# Patient Record
Sex: Male | Born: 1980
Health system: Western US, Academic
[De-identification: ages and names within clinical notes are randomized; demographics above are authoritative.]

## PROBLEM LIST (undated history)

## (undated) ENCOUNTER — Emergency Department (HOSPITAL_COMMUNITY): Admission: EM | Discharge status: Absent without leave | Payer: Self-pay

## (undated) DIAGNOSIS — Z720 Tobacco use: Secondary | ICD-10-CM

## (undated) DIAGNOSIS — F200 Paranoid schizophrenia: Secondary | ICD-10-CM

## (undated) DIAGNOSIS — F1910 Other psychoactive substance abuse, uncomplicated: Secondary | ICD-10-CM

## (undated) DIAGNOSIS — F1010 Alcohol abuse, uncomplicated: Secondary | ICD-10-CM

## (undated) DIAGNOSIS — F419 Anxiety disorder, unspecified: Secondary | ICD-10-CM

## (undated) DIAGNOSIS — F319 Bipolar disorder, unspecified: Secondary | ICD-10-CM

## (undated) DIAGNOSIS — G894 Chronic pain syndrome: Secondary | ICD-10-CM

## (undated) DIAGNOSIS — F909 Attention-deficit hyperactivity disorder, unspecified type: Secondary | ICD-10-CM

## (undated) DIAGNOSIS — F411 Generalized anxiety disorder: Secondary | ICD-10-CM

## (undated) DIAGNOSIS — G4700 Insomnia, unspecified: Secondary | ICD-10-CM

## (undated) DIAGNOSIS — F1994 Other psychoactive substance use, unspecified with psychoactive substance-induced mood disorder: Secondary | ICD-10-CM

---

## 2015-02-09 ENCOUNTER — Encounter (HOSPITAL_COMMUNITY): Payer: Self-pay | Admitting: Psychiatry

## 2015-02-09 ENCOUNTER — Ambulatory Visit (INDEPENDENT_AMBULATORY_CARE_PROVIDER_SITE_OTHER): Payer: 59 | Admitting: Psychiatry

## 2015-02-09 ENCOUNTER — Encounter (INDEPENDENT_AMBULATORY_CARE_PROVIDER_SITE_OTHER): Payer: Self-pay

## 2015-02-09 VITALS — BP 126/88 | HR 94 | Ht 66.0 in | Wt 155.0 lb

## 2015-02-09 DIAGNOSIS — F419 Anxiety disorder, unspecified: Secondary | ICD-10-CM

## 2015-02-09 DIAGNOSIS — F900 Attention-deficit hyperactivity disorder, predominantly inattentive type: Secondary | ICD-10-CM

## 2015-02-09 DIAGNOSIS — F32A Depression, unspecified: Secondary | ICD-10-CM

## 2015-02-09 DIAGNOSIS — F329 Major depressive disorder, single episode, unspecified: Secondary | ICD-10-CM

## 2015-02-09 MED ORDER — BUPROPION HCL ER (XL) 150 MG PO TB24: 150.0000 mg | ORAL_TABLET | ORAL | Status: DC

## 2015-02-09 NOTE — Progress Notes (Signed)
[facility] Initial [facility]  Lucas CarawayChristopher M [name] [id number] 34 y.o.  02/09/2015 10:09 AM  Chief Complaint:  I need medication for my focus and attention.  I will lose my job if I don't pay attention at work.  History of Present Illness:  Patient is 34 year old, Caucasian, single, employed man who is self-referred for seeking treatment for his ADD symptoms.  Patient found very nervous and anxious while he was waiting in the lobby .  Patient told he has symptoms of ADD since childhood and he has given Dexedrine and Ritalin in the school but he has not taken these medication in a while.  He has noticed his symptoms of ADHD is getting worse in recent months.  Patient is working as a [employer] and he has trouble doing multitasking.  He is afraid that he may lose his job as he has given warnings few times because he is unable to finish his work.  Patient also endorsed symptoms of anxiety and panic attack.  He mentioned lately panic attacks are happening 1-2 times a week which is usually last 3-4 minutes.  He is very concerned about the job.  He also endorsed financial strains.  He is living with his cousin who is married to "a stripper" and using steroids.  He wants to move out because he does not like the situation there but he has no money.  He denies any insomnia but admitted racing thoughts, some time irritability, frustration and decreased energy.  He admitted lately notice loss of interest in his daily life and sadness.  He endorsed low self-esteem but denies any active or passive suicidal thoughts or homicidal thought.  His major concerns is lack of focus, attention, difficulty doing multitasking and decreased energy.  Patient denies any mania, psychosis, hallucination, OCD symptoms.  He has limited contact with his parents and his siblings.  Patient told he do not get along with his parents who had history of using drugs and alcohol.  Patient denies using drugs or alcohol.  He  smokes cigarettes but wanted to stop because it is causing financial strain.  Patient denies any anger issues, anhedonia or any grandiosity.  He like to try medication that can help his anxiety and ADD symptoms.  Patient currently not taking any medication and he is not interested in counseling at this time.  Suicidal Ideation: No Plan Formed: No Patient has means to carry out plan: No  Homicidal Ideation: No Plan Formed: No Patient has means to carry out plan: No  Past Psychiatric History/Hospitalization(s) Patient was diagnosed ADHD when he was in the school.  He remember having poor grades but ADHD medication helps passing the high school.  He had tried Dexedrine and Ritalin in the past.  Patient denies any history of suicidal attempt, inpatient psychiatric treatment, mania, psychosis or any hallucination. Anxiety: Yes Bipolar Disorder: No Depression: Yes Mania: No Psychosis: No Schizophrenia: No Personality Disorder: No Hospitalization for psychiatric illness: No History of Electroconvulsive Shock Therapy: No Prior Suicide Attempts: No  Medical History; Patient has no active medical problem.  He has no primary care physician.  He denies any history of seizures, headaches or any other significant health issues.  Traumatic brain injury: Patient denies any history of traumatic brain injury.  Family History; Patient endorsed multiple family member from his father's side has depression, alcohol and drug problem.  Education and Work History; Patient had high school education.  He had worked in the past in [employer].  He  is working as a Estate agent at [employer].   Psychosocial History; Patient born and raised in [place].  His parents divorced when he was only 50 years old.  He was raised by his grandmother.  Patient has very limited contact with his mother and his father.  He has half brother and sister but he has no contact with them.  Currently he is living  with his cousin and he is not happy with his living situation and he wants to move out.  Patient is single and he has no children.  Currently he is not in any relationship.  Legal History; Patient denies any legal issues.  History Of Abuse; Patient denies any history of physical, sexual, verbal or emotional abuse.  Substance Abuse History; Patient admitted history of smoking marijuana but stopped 5 years ago.  He denies drinking or using any illegal substance at this time.  He denies any history of shakes, tremors, withdrawal or any seizures.  Review of Systems: Psychiatric: Agitation: No Hallucination: No Depressed Mood: Yes Insomnia: No Hypersomnia: No Altered Concentration: No Feels Worthless: No Grandiose Ideas: No Belief In Special Powers: No New/Increased Substance Abuse: No Compulsions: No  Neurologic: Headache: No Seizure: No Paresthesias: No  Musculoskeletal: Strength & Muscle Tone: within normal limits Gait & Station: normal Patient leans: N/A   Outpatient Encounter Prescriptions as of 02/09/2015  Medication Sig  . buPROPion (WELLBUTRIN XL) 150 MG 24 hr tablet Take 1 tablet (150 mg total) by mouth every morning.    No results found for this or any previous visit (from the past 2160 hour(s)).    Constitutional:  BP 126/88 mmHg  Pulse 94  Ht  (1.676 m)  Wt 155 lb (70.308 kg)  BMI 25.03 kg/m2   Mental Status Examination;  Patient is casually dressed and fairly groomed.  He appears anxious nervous and restless.  He maintained fair eye contact.  His thought process circumstantial.  His attention and concentration is easily distracted.  Most of the answers he apply with "not really".  He denies any auditory or visual hallucination.  He denies any active or passive suicidal thoughts or homicidal thought.  His psychomotor activity is slightly increased.  He has no tremors, shakes or any EPS.  He described his mood anxious and nervous and his affect is mood  appropriate.  There were no delusions, paranoia or any obsessive thoughts.  His fund of knowledge is fair.  His cognition is intact.  He is alert and oriented 3.  His insight judgment and impulse control is fair.   New problem, with additional work up planned, Review of Psycho-Social Stressors (1), Review or order clinical lab tests (1), Review and summation of old records (2), Established Problem, Worsening (2), New Problem, with no additional work-up planned (3) and Review of Medication Regimen & Side Effects (2)  Assessment: Axis I: ADHD inattentive type, anxiety disorder NOS   Axis II: Deferred  Axis III:  History reviewed. No pertinent past medical history.   Plan:  I review his symptoms, history, current medication and psychosocial stressors.  We will try Wellbutrin XL 150 mg to help his ADD symptoms and anxiety symptoms.  I discuss giving stimulants may cause worsening of his anxiety.  At this time patient does not want any counseling.  Discussed medication side effects especially headaches, GI side effects and in the beginning may increase the anxiety symptoms.  I would also do blood work since he has no blood work in  a while.  I will order CBC, CMP, hemoglobin A1c, TSH and lipid panel.  Recommended to call us back if he has any question or any concern.  Follow-up in 2-3 weeks. Time spent 55 minutes.  More than 50% of the time spent in psychoeducation, counseling and coordination of care.  Discuss safety plan that anytime having active suicidal thoughts or homicidal thoughts then patient need to call 911 or go to the local emergency room.    ARFEEN,SYED T., MD 02/09/2015

## 2015-03-03 ENCOUNTER — Ambulatory Visit (INDEPENDENT_AMBULATORY_CARE_PROVIDER_SITE_OTHER): Payer: 59 | Admitting: Psychiatry

## 2015-03-03 ENCOUNTER — Encounter (HOSPITAL_COMMUNITY): Payer: Self-pay | Admitting: Psychiatry

## 2015-03-03 VITALS — BP 100/71 | HR 98 | Ht 66.0 in | Wt 160.4 lb

## 2015-03-03 DIAGNOSIS — F900 Attention-deficit hyperactivity disorder, predominantly inattentive type: Secondary | ICD-10-CM

## 2015-03-03 DIAGNOSIS — F419 Anxiety disorder, unspecified: Secondary | ICD-10-CM

## 2015-03-03 MED ORDER — LISDEXAMFETAMINE DIMESYLATE 20 MG PO CAPS: 20.0000 mg | ORAL_CAPSULE | Freq: Every day | ORAL | Status: DC

## 2015-03-03 NOTE — Progress Notes (Signed)
[facility] [id number] Progress Note  Lucas [name] [id number] 34 y.o.  03/03/2015 4:13 PM  Chief Complaint:  I do not like Wellbutrin.  It is making me more sad and out of touch.  I stop taking Wellbutrin.    History of Present Illness:  Lucas [name] came for his follow-up appointment.  He is a 34 year old Caucasian single employed man who was seen 3 weeks ago as initial evaluation.  Patient has history of ADHD and lately his symptoms are getting worse.  He has difficulty completing his job.  He is working as a Museum/gallery exhibitions officer and there has been a lot of recent concern about his attention and concentration.  He is afraid that he may lose his job.  He started Wellbutrin however after taking few days he feels more sad, emotional and felt very detached.  He felt that he is out of reality and gets scared and stop the medication.  He remembered taking Ritalin and Dexedrine in the past however he wants to try a different medication because he feels Ritalin and Dexedrine did help some of his attention.  He still have lack of attention, concentration and decreased energy.  He takes a lot of time to finish his job.  He admitted being nervous and anxious about his job.  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  Patient denies drinking or using any illegal substances.  Patient denies any paranoia, hallucination, psychosis or any suicidal thoughts or homicidal thought.  His appetite is okay.  Sometime he takes NyQuil to sleep because he is thinking about his job.  His appetite is okay.  His vitals are stable.  He has not gotten blood work but promised to have it done before his next visit.  Suicidal Ideation: No Plan Formed: No Patient has means to carry out plan: No  Homicidal Ideation: No Plan Formed: No Patient has means to carry out plan: No  Past Psychiatric  History/Hospitalization(s) Patient was diagnosed ADHD when he was in the school.  He remember having poor grades but ADHD medication helps passing the high school.  He had tried Dexedrine and Ritalin in the past.  Patient denies any history of suicidal attempt, inpatient psychiatric treatment, mania, psychosis or any hallucination. Anxiety: Yes Bipolar Disorder: No Depression: Yes Mania: No Psychosis: No Schizophrenia: No Personality Disorder: No Hospitalization for psychiatric illness: No History of Electroconvulsive Shock Therapy: No Prior Suicide Attempts: No  Medical History; Patient has no active medical problem.  He has no primary care physician.  He denies any history of seizures, headaches or any other significant health issues.  Education and Work History; Patient had high school education.  He had worked in the past in [employer].  He is working as a Estate agent at [employer].   Psychosocial History; Patient born and raised in [place].  His parents divorced when he was only 37 years old.  He was raised by his grandmother.  Patient has very limited contact with his mother and his father.  He has half brother and sister but he has no contact with them.  Currently he is living with his cousin and he is not happy with his living situation and he wants to move out.  Patient is single and he has no children.  Currently he is not in any relationship.  Review of  Systems: Psychiatric: Agitation: No Hallucination: No Depressed Mood: Yes Insomnia: No Hypersomnia: No Altered Concentration: No Feels Worthless: No Grandiose Ideas: No Belief In Special Powers: No New/Increased Substance Abuse: No Compulsions: No  Neurologic: Headache: No Seizure: No Paresthesias: No  Musculoskeletal: Strength & Muscle Tone: within normal limits Gait & Station: normal Patient leans: N/A   Outpatient Encounter Prescriptions as of 03/03/2015  Medication Sig  .  lisdexamfetamine (VYVANSE) 20 MG capsule Take 1 capsule (20 mg total) by mouth daily.  . [DISCONTINUED] buPROPion (WELLBUTRIN XL) 150 MG 24 hr tablet Take 1 tablet (150 mg total) by mouth every morning.    No results found for this or any previous visit (from the past 2160 hour(s)).    Constitutional:  BP 100/71 mmHg  Pulse 98  Ht 5\' 6"  (1.676 m)  Wt 160 lb 6.4 oz (72.757 kg)  BMI 25.90 kg/m2   Mental Status Examination;  Patient is poorly groomed and dressed.  He is wearing his uniform.  He appears anxious, nervous and restless.  He maintained fair eye contact.  His thought process circumstantial.  His attention and concentration is easily distracted.  Most of the answers he replied "not really".  He denies any auditory or visual hallucination.  He denies any active or passive suicidal thoughts or homicidal thought.  His psychomotor activity is decreased.  He has no tremors, shakes or any EPS.  He described his mood anxious and nervous and his affect is mood appropriate.  There were no delusions, paranoia or any obsessive thoughts.  His fund of knowledge is fair.  His cognition is intact.  He is alert and oriented 3.  His insight judgment and impulse control is fair.   Review of Psycho-Social Stressors (1), Review and summation of old records (2), Established Problem, Worsening (2), Review of Medication Regimen & Side Effects (2) and Review of New Medication or Change in Dosage (2)  Assessment: Axis I: ADHD inattentive type, anxiety disorder NOS   Axis II: Deferred  Axis III:  History reviewed. No pertinent past medical history.   Plan:  I will discontinue Wellbutrin since patient does not see any improvement and actually got worse emotionally.  He does not want Dexedrine or Ritalin since he has taken in the past and not sure the response of the medication.  I recommended to try Vyvanse to help his ADHD.  I reinforce blood work and he promised to do it before his next appointment.   Discussed medication side effects and benefits.  Recommended to call us back if he has any question or any concern.  Follow-up in 4 weeks.  Time spent 25 minutes  More than 50% of the time spent in psychoeducation, counseling and coordination of care.  Discuss safety plan that anytime having active suicidal thoughts or homicidal thoughts then patient need to call 911 or [name] to the local emergency room.    ARFEEN,SYED T., MD 03/03/2015

## 2015-03-04 ENCOUNTER — Telehealth (HOSPITAL_COMMUNITY): Payer: Self-pay | Admitting: *Deleted

## 2015-03-04 ENCOUNTER — Telehealth (HOSPITAL_COMMUNITY): Payer: Self-pay

## 2015-03-04 NOTE — Telephone Encounter (Signed)
Telephone call with patient to inform him Ok AnisKelly Corbridge, CMA had gotten his Vyvanse authorized and had already called his pharmacy with information so they could fill his needed new order.  Requested patient call back if any more problems getting medication filled.

## 2015-03-04 NOTE — Telephone Encounter (Signed)
[facility]. Number to call is [phone]--[facility].  I spoke with Zack.  Patient was approved. [id number]. Pharmacy notified. Approval will be faxed to office as well.

## 2015-03-28 ENCOUNTER — Telehealth (HOSPITAL_COMMUNITY): Payer: Self-pay | Admitting: *Deleted

## 2015-03-28 ENCOUNTER — Other Ambulatory Visit (HOSPITAL_COMMUNITY): Payer: Self-pay | Admitting: Psychiatry

## 2015-03-28 DIAGNOSIS — F900 Attention-deficit hyperactivity disorder, predominantly inattentive type: Secondary | ICD-10-CM

## 2015-03-28 MED ORDER — LISDEXAMFETAMINE DIMESYLATE 20 MG PO CAPS: 20.0000 mg | ORAL_CAPSULE | Freq: Every day | ORAL | Status: DC

## 2015-03-28 NOTE — Telephone Encounter (Signed)
Dr. Lolly MustacheArfeen,   Patient called.  Patient has an appointment on Monday.  Patient will be out of Adderall on Saturday.  Patient wanted to know if he can get a refill or have to wait to his appointment? Please advise.

## 2015-03-28 NOTE — Telephone Encounter (Signed)
Patient does not have a home number and only contact we have is a work number. Will wait for patient to call us back.  Cannot leave message on a work number. RX is ready for pick up.

## 2015-04-04 ENCOUNTER — Telehealth (HOSPITAL_COMMUNITY): Payer: Self-pay | Admitting: *Deleted

## 2015-04-04 ENCOUNTER — Ambulatory Visit (HOSPITAL_COMMUNITY): Payer: Self-pay | Admitting: Psychiatry

## 2015-04-04 NOTE — Telephone Encounter (Signed)
Patient picked up RX. [place] [id number]::[id number].

## 2015-04-13 ENCOUNTER — Ambulatory Visit (INDEPENDENT_AMBULATORY_CARE_PROVIDER_SITE_OTHER): Payer: 59 | Admitting: Psychiatry

## 2015-04-13 ENCOUNTER — Encounter (HOSPITAL_COMMUNITY): Payer: Self-pay | Admitting: Psychiatry

## 2015-04-13 VITALS — BP 134/86 | HR 99 | Ht 66.0 in | Wt 156.8 lb

## 2015-04-13 DIAGNOSIS — F909 Attention-deficit hyperactivity disorder, unspecified type: Secondary | ICD-10-CM

## 2015-04-13 DIAGNOSIS — F900 Attention-deficit hyperactivity disorder, predominantly inattentive type: Secondary | ICD-10-CM

## 2015-04-13 DIAGNOSIS — F988 Other specified behavioral and emotional disorders with onset usually occurring in childhood and adolescence: Secondary | ICD-10-CM | POA: Insufficient documentation

## 2015-04-13 MED ORDER — LISDEXAMFETAMINE DIMESYLATE 30 MG PO CAPS: 30.0000 mg | ORAL_CAPSULE | Freq: Every day | ORAL | Status: DC

## 2015-04-13 NOTE — Progress Notes (Signed)
[facility]  04/13/2015 2:29 PM Lucas CarawayChristopher M [name]  MRN:  [id number]   Subjective:  I like Vyvanse.  It is helping my focus and attention.  History of presenting illness; Patient came for his follow-up appointment.  He is taking his medication Vyvanse 20 mg is helping his focus and attention.  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  Even though he believes his attention focus is much improved from the past he was told to take Wellbutrin by his company owner.  He had tried Wellbutrin but it make him very zone out and detached.  He is not sure why his company owner insisting to take Wellbutrin. he also endorse a need a letter from the office that he can do his job without any problem and that he is taking the medication regularly.  He admitted sometimes he gets very anxious and nervous about his future .  He has started looking for another job.  He denies any paranoia, hallucination, irritability or any insomnia.  However he gets easily tired and continues to have difficulty doing multitasking.  He forgot his blood work but promised to do it .  He denies drinking or using any illegal substances.  His appetite is okay.  His vitals are stable.  He has no shakes or any tremors.  Diagnosis:   Patient Active Problem List   Diagnosis Date Noted  . ADD (attention deficit disorder) [F90.9] 04/13/2015   Total Time spent with patient: 15 minutes   Past Medical History: History reviewed. No pertinent past medical history. History reviewed. No pertinent past surgical history.   Family History:  Family History  Problem Relation Age of Onset  . Bipolar disorder Paternal Aunt   . Depression Paternal Aunt   . Drug abuse Paternal Aunt   . Schizophrenia Paternal Aunt   . Bipolar disorder Paternal Uncle   . Depression Paternal Uncle   . Alcohol abuse Father    Social History:  Patient is single he has no children.  Currently  he has no relationship.  His parents are divorced.  He is living by himself.   History  Alcohol Use No     History  Drug Use No    History   Social History  . Marital Status: Single    Spouse Name: N/A  . Number of Children: N/A  . Years of Education: N/A   Social History Main Topics  . Smoking status: Current Every Day Smoker -- 1.00 packs/day for 11 years    Types: Cigarettes    Start date: 11/20/2003  . Smokeless tobacco: Never Used  . Alcohol Use: No  . Drug Use: No  . Sexual Activity: Yes    Birth Control/ Protection: Condom   Other Topics Concern  . None   Social History Narrative   Past Psychiatric History:   Patient was diagnosed ADHD when he was in the school. He remember having poor grades but ADHD medication helps passing the high school. He had tried Dexedrine and Ritalin in the past. Patient denies any history of suicidal attempt, inpatient psychiatric treatment, mania, psychosis or any hallucination.  We tried Wellbutrin but it make him zone out and detached.  Sleep: Fair  Appetite:  Good   Assessment:   Musculoskeletal: Strength & Muscle Tone: within normal limits Gait & Station: normal Patient leans: N/A   Psychiatric Specialty Exam: Physical Exam  Review  of Systems  Constitutional: Negative.  Negative for weight loss and malaise/fatigue.  Eyes: Negative for blurred vision.  Cardiovascular: Negative for chest pain and palpitations.  Skin: Negative for itching and rash.  Neurological: Negative for dizziness, tremors and headaches.  Psychiatric/Behavioral: Negative for depression, suicidal ideas and substance abuse. The patient is nervous/anxious. The patient does not have insomnia.     Blood pressure 134/86, pulse 99, height  (1.676 m), weight 156 lb 12.8 oz (71.124 kg).Body mass index is 25.32 kg/(m^2).  General Appearance: Disheveled  Eye Solicitor::  Fair  Speech:  Slurred  Volume:  Decreased  Mood:  Anxious  Affect:  Constricted   Thought Process:  Linear  Orientation:  Full (Time, Place, and Person)  Thought Content:  WDL  Suicidal Thoughts:  No  Homicidal Thoughts:  No  Memory:  Immediate;   Fair Recent;   Fair Remote;   Fair  Judgement:  Intact  Insight:  Fair  Psychomotor Activity:  Decreased  Concentration:  Fair  Recall:  [employer] of Knowledge:Fair  Language: Fair  Akathisia:  No  Handed:  Right  AIMS (if indicated):     Assets:  Communication Skills Desire for Improvement Housing Physical Health Social Support Talents/Skills Transportation  ADL's:  Intact  Cognition: WNL  Sleep:        Current Medications: Current Outpatient Prescriptions  Medication Sig Dispense Refill  . lisdexamfetamine (VYVANSE) 30 MG capsule Take 1 capsule (30 mg total) by mouth daily. 30 capsule 0   No current facility-administered medications for this visit.    Lab Results: No results found for this or any previous visit (from the past 48 hour(s)).  Physical Findings: AIMS:  , ,  ,  ,    CIWA:    COWS:     Treatment Plan Summary: Assessment ; ADD inattentive type, anxiety disorder NOS  Plan; patient is still has symptoms of attention deficit disorder .  His attention and concentration is still fair.  I will increase Vyvanse 30 mg to help the result symptoms of ADD.  Reinforce blood work .  I recommended to have his boss call us if he given him permission to share information.  Patient told he does not want to go back on Wellbutrin and I do agree that he is feeling better with Vyvanse .  Discussed medication side effects and benefits.  Recommended to call us back if he has any question or any concern.  Follow-up in 2 months.  Discuss safety concern that anytime having active suicidal thoughts or homicidal thoughts and he need to call 911 or go to the local emergency room.     Medical Decision Making:    Teira Arcilla T. 04/13/2015, 2:29 PM

## 2015-06-14 ENCOUNTER — Ambulatory Visit (HOSPITAL_COMMUNITY): Payer: Self-pay | Admitting: Psychiatry

## 2018-12-03 ENCOUNTER — Emergency Department (HOSPITAL_BASED_OUTPATIENT_CLINIC_OR_DEPARTMENT_OTHER)
Admission: EM | Admit: 2018-12-03 | Discharge: 2018-12-03 | Disposition: A | Discharge status: Home / Self Care | Payer: BLUE CROSS/BLUE SHIELD | Attending: Emergency Medicine | Admitting: Emergency Medicine

## 2018-12-03 ENCOUNTER — Other Ambulatory Visit: Payer: Self-pay

## 2018-12-03 ENCOUNTER — Encounter (HOSPITAL_BASED_OUTPATIENT_CLINIC_OR_DEPARTMENT_OTHER): Payer: Self-pay

## 2018-12-03 DIAGNOSIS — F909 Attention-deficit hyperactivity disorder, unspecified type: Secondary | ICD-10-CM | POA: Diagnosis not present

## 2018-12-03 DIAGNOSIS — F17210 Nicotine dependence, cigarettes, uncomplicated: Secondary | ICD-10-CM | POA: Insufficient documentation

## 2018-12-03 DIAGNOSIS — F22 Delusional disorders: Secondary | ICD-10-CM | POA: Diagnosis present

## 2018-12-03 DIAGNOSIS — Z79899 Other long term (current) drug therapy: Secondary | ICD-10-CM | POA: Diagnosis not present

## 2018-12-03 DIAGNOSIS — F3162 Bipolar disorder, current episode mixed, moderate: Secondary | ICD-10-CM | POA: Diagnosis not present

## 2018-12-03 HISTORY — DX: Other psychoactive substance abuse, uncomplicated: F19.10

## 2018-12-03 HISTORY — DX: Bipolar disorder, unspecified: F31.9

## 2018-12-03 HISTORY — DX: Attention-deficit hyperactivity disorder, unspecified type: F90.9

## 2018-12-03 LAB — RAPID URINE DRUG SCREEN, HOSP PERFORMED
Amphetamines: NOT DETECTED
Barbiturates: NOT DETECTED
Benzodiazepines: NOT DETECTED
Cocaine: NOT DETECTED
OPIATES: POSITIVE — AB
Tetrahydrocannabinol: NOT DETECTED

## 2018-12-03 NOTE — ED Provider Notes (Signed)
[facility] EMERGENCY DEPARTMENT Provider Note   CSN: [id number] Arrival date & time: 12/03/18  2027     History   Chief Complaint Chief Complaint  Patient presents with  . Medical Clearance    HPI Cari CarawayChristopher M Ayad is a 38 y.o. male.  HPI Patient is a 38 year old male with a history of ADHD and bipolar disorder who presents the emergency department at the request of his family for ongoing delusional thoughts.  He states people continue to state that he is a child molester and is not.  He is not currently on any medication.  He has no homicidal or suicidal thoughts.  He works as a Administratorlandscaper.  He continues to hold a job.  His family states is been going on for several months and they are concerned about the patient.  He also has a known history of substance abuse including snorting opioid medication.   Past Medical History:  Diagnosis Date  . ADHD     Patient Active Problem List   Diagnosis Date Noted  . ADD (attention deficit disorder) 04/13/2015    History reviewed. No pertinent surgical history.      Home Medications    Prior to Admission medications   Medication Sig Start Date End Date Taking? Authorizing Provider  lisdexamfetamine (VYVANSE) 30 MG capsule Take 1 capsule (30 mg total) by mouth daily. 04/13/15   Cleotis NipperArfeen, Syed T, MD    Family History Family History  Problem Relation Age of Onset  . Alcohol abuse Father   . Bipolar disorder Paternal Aunt   . Depression Paternal Aunt   . Drug abuse Paternal Aunt   . Schizophrenia Paternal Aunt   . Bipolar disorder Paternal Uncle   . Depression Paternal Uncle     Social History Social History   Tobacco Use  . Smoking status: Current Every Day Smoker    Packs/day: 1.00    Years: 11.00    Pack years: 11.00    Types: Cigarettes    Start date: 11/20/2003  . Smokeless tobacco: Never Used  Substance Use Topics  . Alcohol use: No    Alcohol/week: 0.0 standard drinks  . Drug use: No      Allergies   Patient has no known allergies.   Review of Systems Review of Systems  All other systems reviewed and are negative.    Physical Exam Updated Vital Signs BP (!) 165/98 (BP Location: Left Arm)   Pulse (!) 113   Temp 98.5 F (36.9 C) (Oral)   Resp 16   Ht 5\' 7"  (1.702 m)   Wt 70.3 kg   SpO2 95%   BMI 24.28 kg/m   Physical Exam Vitals signs and nursing note reviewed.  Constitutional:      Appearance: He is well-developed.  HENT:     Head: Normocephalic.  Neck:     Musculoskeletal: Normal range of motion.  Pulmonary:     Effort: Pulmonary effort is normal.  Abdominal:     General: There is no distension.  Musculoskeletal: Normal range of motion.  Neurological:     Mental Status: He is alert and oriented to person, place, and time.  Psychiatric:        Attention and Perception: Attention normal. He perceives auditory hallucinations.        Mood and Affect: Mood is anxious. Affect is not angry or inappropriate.        Speech: Speech normal. Speech is not rapid and pressured or tangential.  Behavior: Behavior normal. Behavior is not agitated, slowed, aggressive, withdrawn or combative.        Thought Content: Thought content is paranoid and delusional.        Cognition and Memory: Cognition normal.        Judgment: Judgment normal.      ED Treatments / Results  Labs (all labs ordered are listed, but only abnormal results are displayed) Labs Reviewed  RAPID URINE DRUG SCREEN, HOSP PERFORMED - Abnormal; Notable for the following components:      Result Value   Opiates POSITIVE (*)    All other components within normal limits    EKG None  Radiology No results found.  Procedures Procedures (including critical care time)  Medications Ordered in ED Medications - No data to display   Initial Impression / Assessment and Plan / ED Course  I have reviewed the triage vital signs and the nursing notes.  Pertinent labs & imaging results  that were available during my care of the patient were reviewed by me and considered in my medical decision making (see chart for details).     At this time the patient is not a threat to himself or others at this time.  Both he and his family understand that he will need close mental health follow-up.  I do not think he needs involuntary commitment at this time.  I do not think he needs acute psychiatric hospitalization.  He does require urgent psychiatric referral and therefore the patient and the patient's family has been given information regarding [facility].  All parties understand the importance of close follow-up.  I also spoke with the uncle at length in regards to what he can do if his concerns become heightened and he believes the patient is a threat to himself or others including calling the police and filling out involuntary commitment forms with the magistrate.  All questions answered.  Stable for discharge.  Final Clinical Impressions(s) / ED Diagnoses   Final diagnoses:  Delusions (HCC)  Bipolar disorder, current episode mixed, moderate [facility](HCC)    ED Discharge Orders    None       Azalia Bilisampos, Vernie Piet, MD 12/03/18 2122

## 2018-12-03 NOTE — ED Triage Notes (Signed)
Pt states he is here because "people keep saying I'm a child molester and I'm not," apparently, pt's parents are making him check in, pt denies SI/HI, states "everybody is bothering me"

## 2018-12-03 NOTE — ED Notes (Addendum)
Pt eloped from room prior to receiving discharge instructions, retrieved by patient's father. Provided discharge instructions. Pt left at this time with all belongings.

## 2018-12-04 ENCOUNTER — Encounter (HOSPITAL_COMMUNITY): Payer: Self-pay | Admitting: Obstetrics and Gynecology

## 2018-12-04 ENCOUNTER — Other Ambulatory Visit: Payer: Self-pay

## 2018-12-04 ENCOUNTER — Emergency Department (HOSPITAL_COMMUNITY): Payer: BLUE CROSS/BLUE SHIELD

## 2018-12-04 ENCOUNTER — Emergency Department (HOSPITAL_COMMUNITY)
Admission: EM | Admit: 2018-12-04 | Discharge: 2018-12-05 | Disposition: A | Discharge status: Home / Self Care | Payer: BLUE CROSS/BLUE SHIELD | Attending: Emergency Medicine | Admitting: Emergency Medicine

## 2018-12-04 DIAGNOSIS — F1120 Opioid dependence, uncomplicated: Secondary | ICD-10-CM | POA: Insufficient documentation

## 2018-12-04 DIAGNOSIS — F1994 Other psychoactive substance use, unspecified with psychoactive substance-induced mood disorder: Secondary | ICD-10-CM | POA: Diagnosis present

## 2018-12-04 DIAGNOSIS — F17210 Nicotine dependence, cigarettes, uncomplicated: Secondary | ICD-10-CM | POA: Diagnosis not present

## 2018-12-04 DIAGNOSIS — F3110 Bipolar disorder, current episode manic without psychotic features, unspecified: Secondary | ICD-10-CM | POA: Insufficient documentation

## 2018-12-04 DIAGNOSIS — Z046 Encounter for general psychiatric examination, requested by authority: Secondary | ICD-10-CM | POA: Insufficient documentation

## 2018-12-04 DIAGNOSIS — R451 Restlessness and agitation: Secondary | ICD-10-CM | POA: Diagnosis present

## 2018-12-04 DIAGNOSIS — R45851 Suicidal ideations: Secondary | ICD-10-CM | POA: Insufficient documentation

## 2018-12-04 DIAGNOSIS — J069 Acute upper respiratory infection, unspecified: Secondary | ICD-10-CM | POA: Insufficient documentation

## 2018-12-04 LAB — COMPREHENSIVE METABOLIC PANEL WITH GFR
ALT: 26 U/L (ref 0–44)
AST: 45 U/L — ABNORMAL HIGH (ref 15–41)
Albumin: 4.5 g/dL (ref 3.5–5.0)
Alkaline Phosphatase: 62 U/L (ref 38–126)
Anion gap: 13 (ref 5–15)
BUN: 9 mg/dL (ref 6–20)
CO2: 24 mmol/L (ref 22–32)
Calcium: 8.8 mg/dL — ABNORMAL LOW (ref 8.9–10.3)
Chloride: 97 mmol/L — ABNORMAL LOW (ref 98–111)
Creatinine, Ser: 0.99 mg/dL (ref 0.61–1.24)
GFR calc Af Amer: >60 mL/min
GFR calc non Af Amer: >60 mL/min
Glucose, Bld: 113 mg/dL — ABNORMAL HIGH (ref 70–99)
Potassium: 3.8 mmol/L (ref 3.5–5.1)
Sodium: 134 mmol/L — ABNORMAL LOW (ref 135–145)
Total Bilirubin: 0.8 mg/dL (ref 0.3–1.2)
Total Protein: 7.4 g/dL (ref 6.5–8.1)

## 2018-12-04 LAB — RAPID URINE DRUG SCREEN, HOSP PERFORMED
Amphetamines: NOT DETECTED
Barbiturates: NOT DETECTED
Benzodiazepines: NOT DETECTED
Cocaine: NOT DETECTED
Opiates: NOT DETECTED
Tetrahydrocannabinol: NOT DETECTED

## 2018-12-04 LAB — CBC
HCT: 46.6 % (ref 39.0–52.0)
Hemoglobin: 15.9 g/dL (ref 13.0–17.0)
MCH: 31.4 pg (ref 26.0–34.0)
MCHC: 34.1 g/dL (ref 30.0–36.0)
MCV: 91.9 fL (ref 80.0–100.0)
Platelets: 255 10*3/uL (ref 150–400)
RBC: 5.07 MIL/uL (ref 4.22–5.81)
RDW: 13.1 % (ref 11.5–15.5)
WBC: 18.3 10*3/uL — ABNORMAL HIGH (ref 4.0–10.5)
nRBC: 0 % (ref 0.0–0.2)

## 2018-12-04 LAB — SALICYLATE LEVEL: Salicylate Lvl: <7 mg/dL (ref 2.8–30.0)

## 2018-12-04 LAB — ACETAMINOPHEN LEVEL: Acetaminophen (Tylenol), Serum: <10 ug/mL — ABNORMAL LOW (ref 10–30)

## 2018-12-04 LAB — ETHANOL: Alcohol, Ethyl (B): <10 mg/dL

## 2018-12-04 MED ORDER — DEXAMETHASONE 6 MG PO TABS
10.0000 mg | ORAL_TABLET | Freq: Once | ORAL | Status: DC
  Filled 2018-12-04: qty 1

## 2018-12-04 MED ORDER — ALBUTEROL SULFATE HFA 108 (90 BASE) MCG/ACT IN AERS: 2.0000 | INHALATION_SPRAY | RESPIRATORY_TRACT | Status: DC | PRN

## 2018-12-04 MED ORDER — LORAZEPAM 1 MG PO TABS: 0.0000 mg | ORAL_TABLET | Freq: Two times a day (BID) | ORAL | Status: DC

## 2018-12-04 MED ORDER — VITAMIN B-1 100 MG PO TABS: 100.0000 mg | ORAL_TABLET | Freq: Every day | ORAL | Status: DC

## 2018-12-04 MED ORDER — HALOPERIDOL LACTATE 5 MG/ML IJ SOLN
5.0000 mg | Freq: Once | INTRAMUSCULAR | Status: AC
  Administered 2018-12-04: 5 mg via INTRAMUSCULAR
  Filled 2018-12-04: qty 1

## 2018-12-04 MED ORDER — LORAZEPAM 2 MG/ML IJ SOLN
2.0000 mg | Freq: Once | INTRAMUSCULAR | Status: AC
  Administered 2018-12-04: 2 mg via INTRAMUSCULAR
  Filled 2018-12-04: qty 1

## 2018-12-04 MED ORDER — LORAZEPAM 1 MG PO TABS: 0.0000 mg | ORAL_TABLET | Freq: Four times a day (QID) | ORAL | Status: DC

## 2018-12-04 MED ORDER — LORAZEPAM 2 MG/ML IJ SOLN: 0.0000 mg | Freq: Two times a day (BID) | INTRAMUSCULAR | Status: DC

## 2018-12-04 MED ORDER — THIAMINE HCL 100 MG/ML IJ SOLN: 100.0000 mg | Freq: Every day | INTRAMUSCULAR | Status: DC

## 2018-12-04 MED ORDER — LORAZEPAM 2 MG/ML IJ SOLN: 0.0000 mg | Freq: Four times a day (QID) | INTRAMUSCULAR | Status: DC

## 2018-12-04 NOTE — Progress Notes (Signed)
TTS consulted with Assunta Found, NP who recommends inpt treatment. TTS to seek placement. EDP Virgina Norfolk, DO and [facility] nurse have been advised.   Princess Bruins, MSW, LCSW Therapeutic Triage Specialist  [phone]

## 2018-12-04 NOTE — ED Notes (Signed)
Bed: [facility] Expected date:  Expected time:  Means of arrival:  Comments: IVC/GPD
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
## 2018-12-04 NOTE — ED Provider Notes (Signed)
[facility]-EMERGENCY DEPT Provider Note   CSN: [id number] Arrival date & time: 12/04/18  2021     History   Chief Complaint Chief Complaint  Patient presents with  . IVC'd  . Suicidal    HPI Lucas [name] is a 38 y.o. male.  The history is provided by the patient.  Mental Health Problem  Presenting symptoms: agitation, suicidal thoughts and suicidal threats   Patient accompanied by:  Law enforcement Degree of incapacity (severity):  Severe Onset quality:  Sudden Progression:  Worsening Chronicity:  New Context: noncompliance   Treatment compliance:  Untreated Relieved by:  Nothing Worsened by:  Alcohol and drugs Associated symptoms: irritability and poor judgment   Associated symptoms: no abdominal pain and no chest pain   Risk factors: hx of mental illness     Past Medical History:  Diagnosis Date  . ADHD   . Bipolar disorder (HCC)   . Substance abuse [facility])     Patient Active Problem List   Diagnosis Date Noted  . ADD (attention deficit disorder) 04/13/2015    History reviewed. No pertinent surgical history.      Home Medications    Prior to Admission medications   Medication Sig Start Date End Date Taking? Authorizing Provider  lisdexamfetamine (VYVANSE) 30 MG capsule Take 1 capsule (30 mg total) by mouth daily. Patient not taking: Reported on 12/04/2018 04/13/15   Cleotis Nipper, MD    Family History Family History  Problem Relation Age of Onset  . Alcohol abuse Father   . Bipolar disorder Paternal Aunt   . Depression Paternal Aunt   . Drug abuse Paternal Aunt   . Schizophrenia Paternal Aunt   . Bipolar disorder Paternal Uncle   . Depression Paternal Uncle     Social History Social History   Tobacco Use  . Smoking status: Current Every Day Smoker    Packs/day: 1.00    Years: 11.00    Pack years: 11.00    Types: Cigarettes    Start date: 11/20/2003  . Smokeless tobacco: Never Used  Substance Use Topics  .  Alcohol use: Yes    Alcohol/week: 0.0 standard drinks    Comment: Social  . Drug use: Yes    Types: Marijuana     Allergies   Patient has no known allergies.   Review of Systems Review of Systems  Constitutional: Positive for irritability. Negative for chills and fever.  HENT: Negative for ear pain and sore throat.   Eyes: Negative for pain and visual disturbance.  Respiratory: Negative for cough and shortness of breath.   Cardiovascular: Negative for chest pain and palpitations.  Gastrointestinal: Negative for abdominal pain and vomiting.  Genitourinary: Negative for dysuria and hematuria.  Musculoskeletal: Negative for arthralgias and back pain.  Skin: Negative for color change and rash.  Neurological: Negative for seizures and syncope.  Psychiatric/Behavioral: Positive for agitation and suicidal ideas.  All other systems reviewed and are negative.    Physical Exam Updated Vital Signs BP (!) 160/114 (BP Location: Left Arm)   Pulse (!) 112   Temp 98.7 F (37.1 C)   Resp 17   SpO2 94%   Physical Exam Vitals signs and nursing note reviewed.  Constitutional:      Appearance: He is well-developed.  HENT:     Head: Normocephalic and atraumatic.     Nose: Nose normal.     Mouth/Throat:     Mouth: Mucous membranes are moist.  Eyes:  Extraocular Movements: Extraocular movements intact.     Conjunctiva/sclera: Conjunctivae normal.     Pupils: Pupils are equal, round, and reactive to light.  Neck:     Musculoskeletal: Normal range of motion and neck supple.  Cardiovascular:     Rate and Rhythm: Normal rate and regular rhythm.     Heart sounds: No murmur.  Pulmonary:     Effort: Pulmonary effort is normal. No respiratory distress.     Breath sounds: Wheezing (mild) present.  Abdominal:     Palpations: Abdomen is soft.     Tenderness: There is no abdominal tenderness.  Musculoskeletal: Normal range of motion.  Skin:    General: Skin is warm and dry.      Capillary Refill: Capillary refill takes less than 2 seconds.  Neurological:     General: No focal deficit present.     Mental Status: He is alert.  Psychiatric:        Mood and Affect: Mood is anxious. Affect is labile.        Speech: Speech is rapid and pressured.        Behavior: Behavior is agitated and hyperactive.        Thought Content: Thought content includes suicidal ideation. Thought content does not include homicidal ideation. Thought content does not include homicidal or suicidal plan.        Judgment: Judgment is impulsive.      ED Treatments / Results  Labs (all labs ordered are listed, but only abnormal results are displayed) Labs Reviewed  COMPREHENSIVE METABOLIC PANEL - Abnormal; Notable for the following components:      Result Value   Sodium 134 (*)    Chloride 97 (*)    Glucose, Bld 113 (*)    Calcium 8.8 (*)    AST 45 (*)    All other components within normal limits  ACETAMINOPHEN LEVEL - Abnormal; Notable for the following components:   Acetaminophen (Tylenol), Serum <10 (*)    All other components within normal limits  CBC - Abnormal; Notable for the following components:   WBC 18.3 (*)    All other components within normal limits  ETHANOL  SALICYLATE LEVEL  RAPID URINE DRUG SCREEN, HOSP PERFORMED    EKG None  Radiology Dg Chest 2 View  Result Date: 12/04/2018 CLINICAL DATA:  38 year old who took an unknown substance earlier and is currently with acute mental status changes. EXAM: CHEST - 2 VIEW COMPARISON:  None. FINDINGS: Cardiomediastinal silhouette unremarkable. Moderate central peribronchial thickening. Lungs otherwise clear. No localized airspace consolidation. No pleural effusions. No pneumothorax. Normal pulmonary vascularity. Visualized bony thorax intact. IMPRESSION: Moderate central peribronchial thickening likely indicating bronchitis and/or asthma. No acute cardiopulmonary disease otherwise. Electronically Signed   By: Hulan Saashomas  Lawrence  M.D.   On: 12/04/2018 21:30    Procedures .Critical Care Performed by: Virgina Norfolkuratolo, Jaydence Arnesen, DO Authorized by: Virgina Norfolkuratolo, Tonie Vizcarrondo, DO   Critical care provider statement:    Critical care time (minutes):  40   Critical care was necessary to treat or prevent imminent or life-threatening deterioration of the following conditions:  CNS failure or compromise (Aggressive behavior)   Critical care was time spent personally by me on the following activities:  Development of treatment plan with patient or surrogate, evaluation of patient's response to treatment, examination of patient, ordering and performing treatments and interventions, ordering and review of laboratory studies, ordering and review of radiographic studies, pulse oximetry, re-evaluation of patient's condition and review of old charts   I  assumed direction of critical care for this patient from another provider in my specialty: no     (including critical care time)  Medications Ordered in ED Medications  albuterol (PROVENTIL HFA;VENTOLIN HFA) 108 (90 Base) MCG/ACT inhaler 2 puff (has no administration in time range)  dexamethasone (DECADRON) tablet 10 mg (has no administration in time range)  LORazepam (ATIVAN) injection 0-4 mg (has no administration in time range)    Or  LORazepam (ATIVAN) tablet 0-4 mg (has no administration in time range)  LORazepam (ATIVAN) injection 0-4 mg (has no administration in time range)    Or  LORazepam (ATIVAN) tablet 0-4 mg (has no administration in time range)  thiamine (VITAMIN B-1) tablet 100 mg (has no administration in time range)    Or  thiamine (B-1) injection 100 mg (has no administration in time range)  LORazepam (ATIVAN) injection 2 mg (2 mg Intramuscular Given 12/04/18 2311)  haloperidol lactate (HALDOL) injection 5 mg (5 mg Intramuscular Given 12/04/18 2311)     Initial Impression / Assessment and Plan / ED Course  I have reviewed the triage vital signs and the nursing notes.  Pertinent labs  & imaging results that were available during my care of the patient were reviewed by me and considered in my medical decision making (see chart for details).     Cari CarawayChristopher M [name] is a 38 year old male with history of bipolar disorder who presents to the ED with IVC.  Patient with unremarkable vitals.  No fever.  Patient off his medications.  Has been abusing alcohol and drugs.  States that he wants to kill himself because he thinks that he is a child molester.  Patient has mild wheezing, cough.  Is a smoker.  Lab work showed mild leukocytosis, viral changes on chest x-ray.  No obvious pneumonia.  Patient given albuterol, Decadron for likely URI.  Otherwise lab work unremarkable.  Patient was evaluated by psychiatry and they will admit to behavioral health for further care.  Patient awaiting psychiatric placement.  Patient became agitated, aggressive and was given IM Ativan and Haldol for protection of the patient and the staff.  He improved after the medication.  Was placed under psych hold.  This chart was dictated using voice recognition software.  Despite best efforts to proofread,  errors can occur which can change the documentation meaning.   Final Clinical Impressions(s) / ED Diagnoses   Final diagnoses:  Suicidal ideation  Upper respiratory tract infection, unspecified type    ED Discharge Orders    None       Virgina NorfolkCuratolo, Naitik Hermann, DO 12/04/18 2351
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
## 2018-12-05 DIAGNOSIS — F1994 Other psychoactive substance use, unspecified with psychoactive substance-induced mood disorder: Secondary | ICD-10-CM

## 2018-12-05 MED ORDER — NICOTINE 21 MG/24HR TD PT24
21.0000 mg | MEDICATED_PATCH | Freq: Once | TRANSDERMAL | Status: DC
  Administered 2018-12-05: 21 mg via TRANSDERMAL
  Filled 2018-12-05: qty 1

## 2018-12-05 NOTE — ED Notes (Signed)
Pt discharged safely with discharge instructions.  All belongings were returned to pt.  Pt was calm and cooperative at discharge.  Pt denies S/I, H/I, and AVH.

## 2018-12-05 NOTE — Progress Notes (Signed)
Received Hence from the main ED, alert and very restless. He continued to pace, perseverating on several items and unable to follow directions. He was intrusive and increasing loud. Reassuring him he  was in the hospital and not going to jail was in vain. He was medicated with Haldol and Ativan with good results. He is sleeping in his bed without distress. He continued to sleep throughout the morning.

## 2018-12-05 NOTE — Consult Note (Addendum)
[facility] Psych ED Discharge  12/05/2018 12:13 PM Lucas [name]  MRN:  [id number] Principal Problem: Substance induced mood disorder [facility]) Discharge Diagnoses: Principal Problem:   Substance induced mood disorder (HCC)   Subjective: Pt was seen and chart reviewed with treatment team and Dr Sharma Covert. Pt denies suicidal/homicidal ideation, denies auditory/visual hallucinations and does not appear to be responding to internal stimuli. Pt denies drug and alcohol but stated he has been using kratom, which is legal to buy in [place]. Kratom can cause hallucinations if not used properly. Pt lives with his uncle and stated that relationship is going ok. 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 Pt stated he came to the [facility] because he thought he was having a mental breakdown, he was having hallucinations yesterday. His last use of kratom was also yesterday. Pt denies any mental health history except for ADHD and has never been hospitalized in a psychiatric facility. Pt is able to contract for safety upon discharge.   Total Time spent with patient: 30 minutes  Past Psychiatric History: As above   Past Medical History:  Past Medical History:  Diagnosis Date  . ADHD   . Bipolar disorder (HCC)   . Substance abuse (HCC)    History reviewed. No pertinent surgical history. Family History:  Family History  Problem Relation Age of Onset  . Alcohol abuse Father   . Bipolar disorder Paternal Aunt   . Depression Paternal Aunt   . Drug abuse Paternal Aunt   . Schizophrenia Paternal Aunt   . Bipolar disorder Paternal Uncle   . Depression Paternal Uncle    Family Psychiatric  History: As listed above.  Social History:  Social History   Substance and Sexual Activity  Alcohol Use Yes  . Alcohol/week: 0.0 standard drinks   Comment: Social     Social History   Substance and Sexual Activity  Drug Use Yes  . Types: Marijuana    Social History   Socioeconomic History  . Marital status: Single     Spouse name: Not on file  . Number of children: Not on file  . Years of education: Not on file  . Highest education level: Not on file  Occupational History  . Not on file  Social Needs  . Financial resource strain: Not on file  . Food insecurity:    Worry: Not on file    Inability: Not on file  . Transportation needs:    Medical: Not on file    Non-medical: Not on file  Tobacco Use  . Smoking status: Current Every Day Smoker    Packs/day: 1.00    Years: 11.00    Pack years: 11.00    Types: Cigarettes    Start date: 11/20/2003  . Smokeless tobacco: Never Used  Substance and Sexual Activity  . Alcohol use: Yes    Alcohol/week: 0.0 standard drinks    Comment: Social  . Drug use: Yes    Types: Marijuana  . Sexual activity: Yes    Birth control/protection: Condom  Lifestyle  . Physical activity:    Days per week: Not on file    Minutes per session: Not on file  . Stress: Not on file  Relationships  . Social connections:    Talks on phone: Not on file    Gets together: Not on file    Attends religious service: Not on file    Active member of club or organization: Not on file    Attends meetings of clubs or  organizations: Not on file    Relationship status: Not on file  Other Topics Concern  . Not on file  Social History Narrative  . Not on file    Has this patient used any form of tobacco in the last 30 days? (Cigarettes, Smokeless Tobacco, Cigars, and/or Pipes) Prescription not provided because: Pt stated he does not smoke  Current Medications: Current Facility-Administered Medications  Medication Dose Route Frequency Provider Last Rate Last Dose  . albuterol (PROVENTIL HFA;VENTOLIN HFA) 108 (90 Base) MCG/ACT inhaler 2 puff  2 puff Inhalation Q4H PRN Curatolo, Adam, DO      . dexamethasone (DECADRON) tablet 10 mg  10 mg Oral Once Virgina Norfolkuratolo, Adam, DO      . LORazepam (ATIVAN) injection 0-4 mg  0-4 mg Intravenous Q6H Curatolo, Adam, DO       Or  . LORazepam (ATIVAN)  tablet 0-4 mg  0-4 mg Oral Q6H Curatolo, Adam, DO      . [START ON 12/07/2018] LORazepam (ATIVAN) injection 0-4 mg  0-4 mg Intravenous Q12H Curatolo, Adam, DO       Or  . Melene Muller[START ON 12/07/2018] LORazepam (ATIVAN) tablet 0-4 mg  0-4 mg Oral Q12H Curatolo, Adam, DO      . nicotine (NICODERM CQ - dosed in mg/24 hours) patch 21 mg  21 mg Transdermal Once Laveda AbbeParks, Laurie Britton, NP   21 mg at 12/05/18 1154  . thiamine (VITAMIN B-1) tablet 100 mg  100 mg Oral Daily Curatolo, Adam, DO       Or  . thiamine (B-1) injection 100 mg  100 mg Intravenous Daily Curatolo, Adam, DO       Current Outpatient Medications  Medication Sig Dispense Refill  . lisdexamfetamine (VYVANSE) 30 MG capsule Take 1 capsule (30 mg total) by mouth daily. (Patient not taking: Reported on 12/04/2018) 30 capsule 0     Musculoskeletal: Strength & Muscle Tone: within normal limits Gait & Station: normal Patient leans: N/A  Psychiatric Specialty Exam: Physical Exam  Nursing note and vitals reviewed. Constitutional: He is oriented to person, place, and time. He appears well-developed and well-nourished.  HENT:  Head: Normocephalic and atraumatic.  Neck: Normal range of motion.  Respiratory: Effort normal.  Musculoskeletal: Normal range of motion.  Neurological: He is alert and oriented to person, place, and time.  Psychiatric: He has a normal mood and affect. His speech is normal and behavior is normal. Judgment and thought content normal. Cognition and memory are normal.    Review of Systems  Psychiatric/Behavioral: Positive for substance abuse. Negative for hallucinations and suicidal ideas.  All other systems reviewed and are negative.   Blood pressure (!) 144/95, pulse (!) 114, temperature 99 F (37.2 C), temperature source Oral, resp. rate 18, SpO2 96 %.There is no height or weight on file to calculate BMI.  General Appearance: Casual  Eye Contact:  Good  Speech:  Clear and Coherent and Normal Rate  Volume:  Normal   Mood:  Euthymic  Affect:  Congruent  Thought Process:  Coherent, Goal Directed, Linear and Descriptions of Associations: Intact  Orientation:  Full (Time, Place, and Person)  Thought Content:  Logical  Suicidal Thoughts:  No  Homicidal Thoughts:  No  Memory:  Immediate;   Good Recent;   Good Remote;   Fair  Judgement:  Fair  Insight:  Fair  Psychomotor Activity:  Normal  Concentration:  Concentration: Good and Attention Span: Good  Recall:  Good  Fund of Knowledge:  Good  Language:  Good  Akathisia:  No  Handed:  Right  AIMS (if indicated):   N/A  Assets:  Architect Housing Social Support  ADL's:  Intact  Cognition:  WNL  Sleep:   N/A     Demographic Factors:  Male and Caucasian  Loss Factors: Financial problems/change in socioeconomic status  Historical Factors: Family history of mental illness or substance abuse  Risk Reduction Factors:   Sense of responsibility to family and Living with another person, especially a relative  Continued Clinical Symptoms:  Alcohol/Substance Abuse/Dependencies  Cognitive Features That Contribute To Risk:  Closed-mindedness    Suicide Risk:  Minimal: No identifiable suicidal ideation.  Patients presenting with no risk factors but with morbid ruminations; may be classified as minimal risk based on the severity of the depressive symptoms    Plan Of Care/Follow-up recommendations:  Activity:  as tolerated Diet:  heart healthy  Disposition and treatment plan: Substance induced mood disorder (HCC) Take all medications as prescribed by your outpatient providers. Keep all follow-up appointments as scheduled, if you feel that you need mental health help, please call or walk-in to [facility] for assistance. .  Do not consume alcohol or use illegal drugs while on prescription medications. Report any adverse effects from your medications to your primary care provider promptly.  In the event of  recurrent symptoms or worsening symptoms, call 911, a crisis hotline, or go to the nearest emergency department for evaluation.   Laveda Abbe, NP 12/05/2018, 12:13 PM    Patient seen face-to-face for psychiatric evaluation, chart reviewed and case discussed with the physician extender and developed treatment plan. Reviewed the information documented and agree with the treatment plan.  Juanetta Beets, DO 12/05/18 4:07 PM
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
## 2018-12-05 NOTE — Discharge Instructions (Signed)
For your mental health needs, please follow up at: ° °[facility] °[address] °[place], [place] [address] °[phone] ° °

## 2018-12-25 ENCOUNTER — Emergency Department (HOSPITAL_COMMUNITY): Payer: BLUE CROSS/BLUE SHIELD

## 2018-12-25 ENCOUNTER — Encounter (HOSPITAL_COMMUNITY)
Admission: EM | Disposition: A | Discharge status: Other Acute Inpatient Hospital | Payer: Self-pay | Source: Home / Self Care

## 2018-12-25 ENCOUNTER — Inpatient Hospital Stay (HOSPITAL_COMMUNITY)
Admission: EM | Admit: 2018-12-25 | Discharge: 2018-12-25 | DRG: 011 | Disposition: A | Discharge status: Other Acute Inpatient Hospital | Payer: BLUE CROSS/BLUE SHIELD | Attending: General Surgery | Admitting: General Surgery

## 2018-12-25 ENCOUNTER — Emergency Department (HOSPITAL_COMMUNITY): Payer: BLUE CROSS/BLUE SHIELD | Admitting: Anesthesiology

## 2018-12-25 ENCOUNTER — Encounter (HOSPITAL_COMMUNITY): Payer: Self-pay | Admitting: Anesthesiology

## 2018-12-25 DIAGNOSIS — S0282XB Fracture of other specified skull and facial bones, left side, initial encounter for open fracture: Principal | ICD-10-CM | POA: Diagnosis present

## 2018-12-25 DIAGNOSIS — J9601 Acute respiratory failure with hypoxia: Secondary | ICD-10-CM | POA: Diagnosis present

## 2018-12-25 DIAGNOSIS — R58 Hemorrhage, not elsewhere classified: Secondary | ICD-10-CM | POA: Diagnosis present

## 2018-12-25 DIAGNOSIS — X730XXA Intentional self-harm by shotgun discharge, initial encounter: Secondary | ICD-10-CM | POA: Diagnosis not present

## 2018-12-25 DIAGNOSIS — R739 Hyperglycemia, unspecified: Secondary | ICD-10-CM | POA: Diagnosis present

## 2018-12-25 DIAGNOSIS — J9690 Respiratory failure, unspecified, unspecified whether with hypoxia or hypercapnia: Secondary | ICD-10-CM

## 2018-12-25 DIAGNOSIS — D62 Acute posthemorrhagic anemia: Secondary | ICD-10-CM | POA: Diagnosis present

## 2018-12-25 DIAGNOSIS — S0993XA Unspecified injury of face, initial encounter: Secondary | ICD-10-CM

## 2018-12-25 DIAGNOSIS — W3400XA Accidental discharge from unspecified firearms or gun, initial encounter: Secondary | ICD-10-CM

## 2018-12-25 DIAGNOSIS — R51 Headache: Secondary | ICD-10-CM | POA: Diagnosis present

## 2018-12-25 HISTORY — PX: WOUND EXPLORATION: SHX6188

## 2018-12-25 HISTORY — PX: TRACHEOSTOMY TUBE PLACEMENT: SHX814

## 2018-12-25 LAB — COMPREHENSIVE METABOLIC PANEL
ALT: 17 U/L (ref 0–44)
AST: 25 U/L (ref 15–41)
Albumin: 3.7 g/dL (ref 3.5–5.0)
Alkaline Phosphatase: 56 U/L (ref 38–126)
Anion gap: 14 (ref 5–15)
BUN: 10 mg/dL (ref 6–20)
CHLORIDE: 100 mmol/L (ref 98–111)
CO2: 22 mmol/L (ref 22–32)
Calcium: 8.7 mg/dL — ABNORMAL LOW (ref 8.9–10.3)
Creatinine, Ser: 1.17 mg/dL (ref 0.61–1.24)
GFR calc non Af Amer: >60 mL/min (ref >60)
Glucose, Bld: 220 mg/dL — ABNORMAL HIGH (ref 70–99)
Potassium: 3.6 mmol/L (ref 3.5–5.1)
Sodium: 136 mmol/L (ref 135–145)
Total Bilirubin: 0.8 mg/dL (ref 0.3–1.2)
Total Protein: 6.4 g/dL — ABNORMAL LOW (ref 6.5–8.1)

## 2018-12-25 LAB — POCT I-STAT 4, (NA,K, GLUC, HGB,HCT)
Glucose, Bld: 180 mg/dL — ABNORMAL HIGH (ref 70–99)
HCT: 39 % (ref 39.0–52.0)
HEMOGLOBIN: 13.3 g/dL (ref 13.0–17.0)
Potassium: 5.3 mmol/L — ABNORMAL HIGH (ref 3.5–5.1)
SODIUM: 139 mmol/L (ref 135–145)

## 2018-12-25 LAB — POCT I-STAT 7, (LYTES, BLD GAS, ICA,H+H)
ACID-BASE EXCESS: 1 mmol/L (ref 0.0–2.0)
Bicarbonate: 27.2 mmol/L (ref 20.0–28.0)
Calcium, Ion: 1.06 mmol/L — ABNORMAL LOW (ref 1.15–1.40)
HCT: 36 % — ABNORMAL LOW (ref 39.0–52.0)
Hemoglobin: 12.2 g/dL — ABNORMAL LOW (ref 13.0–17.0)
O2 Saturation: 99 %
Patient temperature: 36.6
Potassium: 4.3 mmol/L (ref 3.5–5.1)
Sodium: 138 mmol/L (ref 135–145)
TCO2: 29 mmol/L (ref 22–32)
pCO2 arterial: 47.5 mmHg (ref 32.0–48.0)
pH, Arterial: 7.364 (ref 7.350–7.450)
pO2, Arterial: 153 mmHg — ABNORMAL HIGH (ref 83.0–108.0)

## 2018-12-25 LAB — ABO/RH: ABO/RH(D): A POS

## 2018-12-25 LAB — TRIGLYCERIDES: Triglycerides: 50 mg/dL (ref <150)

## 2018-12-25 LAB — BLOOD GAS, ARTERIAL
Acid-Base Excess: 0.8 mmol/L (ref 0.0–2.0)
Bicarbonate: 27.9 mmol/L (ref 20.0–28.0)
Drawn by: 347621
FIO2: 100
LHR: 16 {breaths}/min
MECHVT: 490 mL
O2 Saturation: 99.2 %
PATIENT TEMPERATURE: 97
PCO2 ART: 68.8 mmHg — AB (ref 32.0–48.0)
PEEP: 8 cmH2O
pH, Arterial: 7.225 — ABNORMAL LOW (ref 7.350–7.450)
pO2, Arterial: 287 mmHg — ABNORMAL HIGH (ref 83.0–108.0)

## 2018-12-25 LAB — CBC
HCT: 42.2 % (ref 39.0–52.0)
HCT: 32.5 % — ABNORMAL LOW (ref 39.0–52.0)
Hemoglobin: 14.3 g/dL (ref 13.0–17.0)
Hemoglobin: 11.4 g/dL — ABNORMAL LOW (ref 13.0–17.0)
MCH: 30.6 pg (ref 26.0–34.0)
MCH: 31.1 pg (ref 26.0–34.0)
MCHC: 33.9 g/dL (ref 30.0–36.0)
MCHC: 35.1 g/dL (ref 30.0–36.0)
MCV: 90.4 fL (ref 80.0–100.0)
MCV: 88.6 fL (ref 80.0–100.0)
NRBC: 0 % (ref 0.0–0.2)
NRBC: 0 % (ref 0.0–0.2)
Platelets: 245 10*3/uL (ref 150–400)
Platelets: UNDETERMINED 10*3/uL (ref 150–400)
RBC: 4.67 MIL/uL (ref 4.22–5.81)
RBC: 3.67 MIL/uL — ABNORMAL LOW (ref 4.22–5.81)
RDW: 12.7 % (ref 11.5–15.5)
RDW: 14.4 % (ref 11.5–15.5)
WBC: 18.6 10*3/uL — ABNORMAL HIGH (ref 4.0–10.5)
WBC: 14.2 10*3/uL — ABNORMAL HIGH (ref 4.0–10.5)

## 2018-12-25 LAB — URINALYSIS, ROUTINE W REFLEX MICROSCOPIC
BILIRUBIN URINE: NEGATIVE
Glucose, UA: NEGATIVE mg/dL
Hgb urine dipstick: NEGATIVE
KETONES UR: NEGATIVE mg/dL
Leukocytes, UA: NEGATIVE
Nitrite: NEGATIVE
Protein, ur: NEGATIVE mg/dL
Specific Gravity, Urine: 1.023 (ref 1.005–1.030)
pH: 5 (ref 5.0–8.0)

## 2018-12-25 LAB — PROTIME-INR
INR: 1.09
PROTHROMBIN TIME: 14 s (ref 11.4–15.2)

## 2018-12-25 LAB — GLUCOSE, CAPILLARY
GLUCOSE-CAPILLARY: 155 mg/dL — AB (ref 70–99)
Glucose-Capillary: 124 mg/dL — ABNORMAL HIGH (ref 70–99)
Glucose-Capillary: 119 mg/dL — ABNORMAL HIGH (ref 70–99)

## 2018-12-25 LAB — RAPID HIV SCREEN (HIV 1/2 AB+AG)
HIV 1/2 Antibodies: NONREACTIVE
HIV-1 P24 Antigen - HIV24: NONREACTIVE

## 2018-12-25 LAB — ETHANOL: Alcohol, Ethyl (B): <10 mg/dL (ref <10)

## 2018-12-25 LAB — CDS SEROLOGY

## 2018-12-25 LAB — LACTIC ACID, PLASMA: Lactic Acid, Venous: 3.2 mmol/L (ref 0.5–1.9)

## 2018-12-25 LAB — BLOOD PRODUCT ORDER (VERBAL) VERIFICATION

## 2018-12-25 SURGERY — CREATION, TRACHEOSTOMY
Anesthesia: General | Site: Neck

## 2018-12-25 MED ORDER — FENTANYL CITRATE (PF) 250 MCG/5ML IJ SOLN
INTRAMUSCULAR | Status: DC | PRN
  Administered 2018-12-25: 50 ug via INTRAVENOUS
  Administered 2018-12-25 (×2): 100 ug via INTRAVENOUS

## 2018-12-25 MED ORDER — DEXTROSE-NACL 5-0.9 % IV SOLN: 10.0000 mL/h | INTRAVENOUS | Status: DC

## 2018-12-25 MED ORDER — PANTOPRAZOLE SODIUM 40 MG IV SOLR: 40.0000 mg | INTRAVENOUS | Status: DC

## 2018-12-25 MED ORDER — PANTOPRAZOLE SODIUM 40 MG IV SOLR
40.0000 mg | INTRAVENOUS | Status: DC
  Administered 2018-12-25: 40 mg via INTRAVENOUS
  Filled 2018-12-25: qty 40

## 2018-12-25 MED ORDER — IPRATROPIUM-ALBUTEROL 0.5-2.5 (3) MG/3ML IN SOLN
3.0000 mL | Freq: Four times a day (QID) | RESPIRATORY_TRACT | Status: DC
  Administered 2018-12-25 (×2): 3 mL via RESPIRATORY_TRACT
  Filled 2018-12-25 (×2): qty 3

## 2018-12-25 MED ORDER — PHENYLEPHRINE HCL 10 MG/ML IJ SOLN
INTRAMUSCULAR | Status: DC | PRN
  Administered 2018-12-25: 80 ug via INTRAVENOUS

## 2018-12-25 MED ORDER — HYDROMORPHONE HCL 1 MG/ML IJ SOLN
1.0000 mg | INTRAMUSCULAR | Status: DC | PRN
  Administered 2018-12-25: 1 mg via INTRAVENOUS
  Filled 2018-12-25: qty 1

## 2018-12-25 MED ORDER — ROCURONIUM BROMIDE 100 MG/10ML IV SOLN
INTRAVENOUS | Status: DC | PRN
  Administered 2018-12-25 (×2): 50 mg via INTRAVENOUS

## 2018-12-25 MED ORDER — FENTANYL CITRATE (PF) 50 MCG/ML IJ SOLN
0.00 | INTRAMUSCULAR | Status: DC
Start: ? — End: 2018-12-25

## 2018-12-25 MED ORDER — PROPOFOL 1000 MG/100ML IV EMUL: 5.0000 ug/kg/min | INTRAVENOUS | Status: DC

## 2018-12-25 MED ORDER — ORAL CARE MOUTH RINSE: 15.0000 mL | OROMUCOSAL | 0 refills | Status: AC

## 2018-12-25 MED ORDER — CHLORHEXIDINE GLUCONATE 0.12 % MT SOLN
15.00 | OROMUCOSAL | Status: DC
Start: ? — End: 2018-12-25

## 2018-12-25 MED ORDER — ONDANSETRON 4 MG PO TBDP: 4.0000 mg | ORAL_TABLET | Freq: Four times a day (QID) | ORAL | Status: DC | PRN

## 2018-12-25 MED ORDER — PROPOFOL 1000 MG/100ML IV EMUL
INTRAVENOUS | Status: AC | PRN
  Administered 2018-12-25: 80 ug/kg/min via INTRAVENOUS

## 2018-12-25 MED ORDER — FENTANYL 2500MCG IN NS 250ML (10MCG/ML) PREMIX INFUSION: 25.0000 ug/h | INTRAVENOUS | Status: DC

## 2018-12-25 MED ORDER — FENTANYL BOLUS VIA INFUSION: 50.0000 ug | INTRAVENOUS | 0 refills | Status: DC | PRN

## 2018-12-25 MED ORDER — ETOMIDATE 2 MG/ML IV SOLN
INTRAVENOUS | Status: AC | PRN
  Administered 2018-12-25: 20 mg via INTRAVENOUS
  Administered 2018-12-25: 10 mg via INTRAVENOUS

## 2018-12-25 MED ORDER — LACTATED RINGERS IV SOLN
INTRAVENOUS | Status: DC | PRN
  Administered 2018-12-25: 02:00:00 via INTRAVENOUS

## 2018-12-25 MED ORDER — DEXTROSE-NACL 5-0.9 % IV SOLN
INTRAVENOUS | Status: DC
  Administered 2018-12-25 (×2): via INTRAVENOUS

## 2018-12-25 MED ORDER — EPHEDRINE 5 MG/ML INJ
INTRAVENOUS | Status: AC
  Filled 2018-12-25: qty 10

## 2018-12-25 MED ORDER — FENTANYL BOLUS VIA INFUSION
50.0000 ug | INTRAVENOUS | Status: DC | PRN
  Filled 2018-12-25: qty 50

## 2018-12-25 MED ORDER — ONDANSETRON HCL 4 MG/2ML IJ SOLN: 4.0000 mg | Freq: Four times a day (QID) | INTRAMUSCULAR | Status: DC | PRN

## 2018-12-25 MED ORDER — INSULIN ASPART 100 UNIT/ML ~~LOC~~ SOLN
0.0000 [IU] | SUBCUTANEOUS | Status: DC
  Administered 2018-12-25: 2 [IU] via SUBCUTANEOUS
  Administered 2018-12-25: 3 [IU] via SUBCUTANEOUS

## 2018-12-25 MED ORDER — MIDAZOLAM HCL 5 MG/5ML IJ SOLN
INTRAMUSCULAR | Status: DC | PRN
  Administered 2018-12-25: 2 mg via INTRAVENOUS

## 2018-12-25 MED ORDER — CHLORHEXIDINE GLUCONATE 0.12% ORAL RINSE (MEDLINE KIT): 15.0000 mL | Freq: Two times a day (BID) | OROMUCOSAL | 0 refills | Status: DC

## 2018-12-25 MED ORDER — METOPROLOL TARTRATE 5 MG/5ML IV SOLN: 5.0000 mg | Freq: Four times a day (QID) | INTRAVENOUS | Status: DC | PRN

## 2018-12-25 MED ORDER — SODIUM CHLORIDE 0.9 % IV SOLN
INTRAVENOUS | Status: DC | PRN
  Administered 2018-12-25: 02:00:00 via INTRAVENOUS

## 2018-12-25 MED ORDER — ONDANSETRON 4 MG PO TBDP: 4.0000 mg | ORAL_TABLET | Freq: Four times a day (QID) | ORAL | 0 refills | Status: DC | PRN

## 2018-12-25 MED ORDER — LORAZEPAM 2 MG/ML IJ SOLN: 1.0000 mg | INTRAMUSCULAR | 0 refills | Status: DC | PRN

## 2018-12-25 MED ORDER — FENTANYL CITRATE (PF) 250 MCG/5ML IJ SOLN
INTRAMUSCULAR | Status: AC
  Filled 2018-12-25: qty 5

## 2018-12-25 MED ORDER — SUCCINYLCHOLINE CHLORIDE 20 MG/ML IJ SOLN
INTRAMUSCULAR | Status: AC | PRN
  Administered 2018-12-25: 100 mg via INTRAVENOUS

## 2018-12-25 MED ORDER — KETAMINE HCL 50 MG/5ML IJ SOSY
100.0000 mg | PREFILLED_SYRINGE | INTRAMUSCULAR | Status: AC
  Administered 2018-12-25: 100 mg via INTRAVENOUS

## 2018-12-25 MED ORDER — ALBUMIN HUMAN 5 % IV SOLN
INTRAVENOUS | Status: AC
  Filled 2018-12-25: qty 250

## 2018-12-25 MED ORDER — PROPOFOL 10 MG/ML IV BOLUS
INTRAVENOUS | Status: AC
  Filled 2018-12-25: qty 20

## 2018-12-25 MED ORDER — PROPOFOL 1000 MG/100ML IV EMUL
5.0000 ug/kg/min | INTRAVENOUS | Status: DC
  Administered 2018-12-25: 40 ug/kg/min via INTRAVENOUS
  Administered 2018-12-25: 35 ug/kg/min via INTRAVENOUS
  Filled 2018-12-25 (×2): qty 100

## 2018-12-25 MED ORDER — INSULIN ASPART 100 UNIT/ML ~~LOC~~ SOLN: 0.0000 [IU] | SUBCUTANEOUS | 11 refills | Status: DC

## 2018-12-25 MED ORDER — MIDAZOLAM HCL 2 MG/2ML IJ SOLN
INTRAMUSCULAR | Status: AC
  Filled 2018-12-25: qty 2

## 2018-12-25 MED ORDER — CEFAZOLIN SODIUM-DEXTROSE 2-3 GM-%(50ML) IV SOLR
INTRAVENOUS | Status: DC | PRN
  Administered 2018-12-25: 2 g via INTRAVENOUS

## 2018-12-25 MED ORDER — SODIUM CHLORIDE 0.9% IV SOLUTION: Freq: Once | INTRAVENOUS | Status: DC

## 2018-12-25 MED ORDER — SENNOSIDES-DOCUSATE SODIUM 8.6-50 MG PO TABS
2.00 | ORAL_TABLET | ORAL | Status: DC
Start: 2019-02-04 — End: 2018-12-25

## 2018-12-25 MED ORDER — LIDOCAINE-EPINEPHRINE 1 %-1:100000 IJ SOLN
INTRAMUSCULAR | Status: DC | PRN
  Administered 2018-12-25: 1 mL

## 2018-12-25 MED ORDER — MIDAZOLAM HCL 5 MG/5ML IJ SOLN
INTRAMUSCULAR | Status: AC | PRN
  Administered 2018-12-25: 4 mg via INTRAVENOUS

## 2018-12-25 MED ORDER — FAMOTIDINE 20 MG PO TABS
20.00 | ORAL_TABLET | ORAL | Status: DC
Start: 2018-12-26 — End: 2018-12-25

## 2018-12-25 MED ORDER — ROCURONIUM BROMIDE 50 MG/5ML IV SOLN
INTRAVENOUS | Status: AC | PRN
  Administered 2018-12-25: 50 mg via INTRAVENOUS
  Administered 2018-12-25: 100 mg via INTRAVENOUS
  Administered 2018-12-25: 50 mg via INTRAVENOUS

## 2018-12-25 MED ORDER — FENTANYL 2500MCG IN NS 250ML (10MCG/ML) PREMIX INFUSION
25.0000 ug/h | INTRAVENOUS | Status: DC
  Administered 2018-12-25: 50 ug/h via INTRAVENOUS
  Filled 2018-12-25: qty 250

## 2018-12-25 MED ORDER — LIDOCAINE-EPINEPHRINE 1 %-1:100000 IJ SOLN
INTRAMUSCULAR | Status: AC
  Filled 2018-12-25: qty 1

## 2018-12-25 MED ORDER — MUPIROCIN 2 % EX OINT
TOPICAL_OINTMENT | CUTANEOUS | Status: DC
Start: 2018-12-27 — End: 2018-12-25

## 2018-12-25 MED ORDER — PROPOFOL 100 MG/10ML IV EMUL
0.00 | INTRAVENOUS | Status: DC
Start: ? — End: 2018-12-25

## 2018-12-25 MED ORDER — POLYETHYLENE GLYCOL 3350 17 G PO PACK
17.00 | PACK | ORAL | Status: DC
Start: 2019-02-05 — End: 2018-12-25

## 2018-12-25 MED ORDER — LACTATED RINGERS IV SOLN
INTRAVENOUS | Status: DC
Start: ? — End: 2018-12-25

## 2018-12-25 MED ORDER — ORAL CARE MOUTH RINSE
15.0000 mL | OROMUCOSAL | Status: DC
  Administered 2018-12-25 (×3): 15 mL via OROMUCOSAL

## 2018-12-25 MED ORDER — LORAZEPAM 2 MG/ML IJ SOLN: 1.0000 mg | INTRAMUSCULAR | Status: DC | PRN

## 2018-12-25 MED ORDER — ACETAMINOPHEN 325 MG PO TABS
325.00 | ORAL_TABLET | ORAL | Status: DC
Start: 2019-02-04 — End: 2018-12-25

## 2018-12-25 MED ORDER — FENTANYL CITRATE (PF) 100 MCG/2ML IJ SOLN
50.0000 ug | Freq: Once | INTRAMUSCULAR | Status: AC
  Administered 2018-12-25: 50 ug via INTRAVENOUS

## 2018-12-25 MED ORDER — HYDROMORPHONE HCL 1 MG/ML IJ SOLN: 1.0000 mg | INTRAMUSCULAR | 0 refills | Status: DC | PRN

## 2018-12-25 MED ORDER — LEVETIRACETAM 500 MG PO TABS
500.00 | ORAL_TABLET | ORAL | Status: DC
Start: 2018-12-26 — End: 2018-12-25

## 2018-12-25 MED ORDER — 0.9 % SODIUM CHLORIDE (POUR BTL) OPTIME
TOPICAL | Status: DC | PRN
  Administered 2018-12-25: 1000 mL

## 2018-12-25 MED ORDER — ROCURONIUM BROMIDE 50 MG/5ML IV SOSY
PREFILLED_SYRINGE | INTRAVENOUS | Status: AC
  Filled 2018-12-25: qty 10

## 2018-12-25 MED ORDER — PROPOFOL 1000 MG/100ML IV EMUL
INTRAVENOUS | Status: AC
  Filled 2018-12-25: qty 100

## 2018-12-25 MED ORDER — CEFAZOLIN SODIUM 1 G IJ SOLR
INTRAMUSCULAR | Status: AC
  Filled 2018-12-25: qty 20

## 2018-12-25 MED ORDER — ALBUMIN HUMAN 5 % IV SOLN
25.0000 g | Freq: Once | INTRAVENOUS | Status: AC
  Administered 2018-12-25: 25 g via INTRAVENOUS
  Filled 2018-12-25: qty 250

## 2018-12-25 MED ORDER — CHLORHEXIDINE GLUCONATE 0.12% ORAL RINSE (MEDLINE KIT)
15.0000 mL | Freq: Two times a day (BID) | OROMUCOSAL | Status: DC
  Administered 2018-12-25: 15 mL via OROMUCOSAL

## 2018-12-25 MED ORDER — IPRATROPIUM-ALBUTEROL 0.5-2.5 (3) MG/3ML IN SOLN: 3.0000 mL | Freq: Four times a day (QID) | RESPIRATORY_TRACT | Status: DC

## 2018-12-25 MED ORDER — CHLORHEXIDINE GLUCONATE 0.12 % MT SOLN
15.00 | OROMUCOSAL | Status: DC
Start: 2018-12-27 — End: 2018-12-25

## 2018-12-25 MED ORDER — PHENYLEPHRINE 40 MCG/ML (10ML) SYRINGE FOR IV PUSH (FOR BLOOD PRESSURE SUPPORT)
PREFILLED_SYRINGE | INTRAVENOUS | Status: AC
  Filled 2018-12-25: qty 10

## 2018-12-25 SURGICAL SUPPLY — 48 items
BLADE CLIPPER SURG (BLADE) IMPLANT
BLADE SURG 15 STRL LF DISP TIS (BLADE) IMPLANT
BLADE SURG 15 STRL SS (BLADE)
BNDG GAUZE ELAST 4 BULKY (GAUZE/BANDAGES/DRESSINGS) ×4 IMPLANT
CANISTER SUCT 3000ML PPV (MISCELLANEOUS) ×3 IMPLANT
CLEANER TIP ELECTROSURG 2X2 (MISCELLANEOUS) ×4 IMPLANT
CORD BIPOLAR FORCEPS 12FT (ELECTRODE) ×1 IMPLANT
COVER SURGICAL LIGHT HANDLE (MISCELLANEOUS) ×3 IMPLANT
COVER WAND RF STERILE (DRAPES) ×3 IMPLANT
DRAPE HALF SHEET 40X57 (DRAPES) IMPLANT
DRAPE U-SHAPE 76X120 STRL (DRAPES) ×1 IMPLANT
DRSG GLASSCOCK MASTOID ADT (GAUZE/BANDAGES/DRESSINGS) ×3 IMPLANT
DRSG PAD ABDOMINAL 8X10 ST (GAUZE/BANDAGES/DRESSINGS) ×2 IMPLANT
ELECT COATED BLADE 2.86 ST (ELECTRODE) ×5 IMPLANT
ELECT REM PT RETURN 9FT ADLT (ELECTROSURGICAL) ×3
ELECTRODE REM PT RTRN 9FT ADLT (ELECTROSURGICAL) ×2 IMPLANT
FORCEPS BIPOLAR SPETZLER 8 1.0 (NEUROSURGERY SUPPLIES) ×1 IMPLANT
GAUZE 4X4 16PLY RFD (DISPOSABLE) ×3 IMPLANT
GAUZE SPONGE 4X4 12PLY STRL (GAUZE/BANDAGES/DRESSINGS) ×3 IMPLANT
GAUZE XEROFORM 5X9 LF (GAUZE/BANDAGES/DRESSINGS) IMPLANT
GLOVE BIOGEL M 7.0 STRL (GLOVE) ×6 IMPLANT
GOWN STRL REUS W/ TWL LRG LVL3 (GOWN DISPOSABLE) ×2 IMPLANT
GOWN STRL REUS W/TWL LRG LVL3 (GOWN DISPOSABLE) ×3
HOLDER TRACH TUBE VELCRO 19.5 (MISCELLANEOUS) IMPLANT
KIT BASIN OR (CUSTOM PROCEDURE TRAY) ×3 IMPLANT
KIT SUCTION CATH 14FR (SUCTIONS) IMPLANT
KIT TURNOVER KIT B (KITS) ×3 IMPLANT
NDL HYPO 25GX1X1/2 BEV (NEEDLE) ×2 IMPLANT
NEEDLE HYPO 25GX1X1/2 BEV (NEEDLE) ×3 IMPLANT
NS IRRIG 1000ML POUR BTL (IV SOLUTION) ×3 IMPLANT
PACK EENT II TURBAN DRAPE (CUSTOM PROCEDURE TRAY) ×3 IMPLANT
PAD ARMBOARD 7.5X6 YLW CONV (MISCELLANEOUS) ×6 IMPLANT
PENCIL BUTTON HOLSTER BLD 10FT (ELECTRODE) ×4 IMPLANT
SPONGE DRAIN TRACH 4X4 STRL 2S (GAUZE/BANDAGES/DRESSINGS) ×3 IMPLANT
SPONGE INTESTINAL PEANUT (DISPOSABLE) ×3 IMPLANT
SPONGE LAP 18X18 RF (DISPOSABLE) ×4 IMPLANT
SUT CHROMIC 2 0 SH (SUTURE) ×3 IMPLANT
SUT ETHILON 2 0 FS 18 (SUTURE) ×7 IMPLANT
SUT SILK 2 0 PERMA HAND 18 BK (SUTURE) ×3 IMPLANT
SUT SILK 2 0 SH CR/8 (SUTURE) ×2 IMPLANT
SUT SILK 3 0 REEL (SUTURE) ×3 IMPLANT
SYR 20CC LL (SYRINGE) ×3 IMPLANT
SYR BULB IRRIGATION 50ML (SYRINGE) IMPLANT
SYR CONTROL 10ML LL (SYRINGE) ×3 IMPLANT
TRAY FOLEY IC TEMP SENS 14FR (CATHETERS) ×1 IMPLANT
TUBE CONNECTING 12X1/4 (SUCTIONS) ×3 IMPLANT
TUBE TRACH SHILEY 8 DIST CUF (TUBING) ×3 IMPLANT
WATER STERILE IRR 1000ML POUR (IV SOLUTION) ×3 IMPLANT

## 2018-12-25 NOTE — Progress Notes (Signed)
Patient ID: Lucas [name], male   DOB: May 10, 1981, 38 y.o.   MRN: [id number] I spoke with Dr. Alona BeneJason Hoth. [facility]. I will have his imaging placed on a disc.  Violeta GelinasBurke Ane Conerly, MD, MPH, FACS Trauma: [phone] General Surgery: [phone]
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
## 2018-12-25 NOTE — Progress Notes (Signed)
Report called to nurse at [facility]. All questions answered. Family to be updated. Will continue to monitor. Dicie Beam RN BSN.
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
## 2018-12-25 NOTE — Progress Notes (Signed)
Patient ID: Lucas [name], male   DOB: 18-Apr-1981, 38 y.o.   MRN: [id number] Dr. Su Hoff with ENT at [facility] has agreed to take over reconstruction. I attempted to transfer to [facility] Trauma Service but they do not have any beds at this time. We will try again tomorrow.  Violeta Gelinas, MD, MPH, FACS Trauma: [phone] General Surgery: [phone]

## 2018-12-25 NOTE — Progress Notes (Signed)
Pt transferred to care of [facility] team. Report given at bedside. HME/[facility] changed prior to transport due to copious amounts of hemoptysis being coughed up.
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
## 2018-12-25 NOTE — Progress Notes (Signed)
Patient's family alerted to nursing stuff that patient has been taking "Kratom" at extremely high doses and that is when the mental instabilities increased dramatically.   Also mentioned he has been taking unknown amount and type of pills, as well as smoking 3 packs of cigarettes a day.
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
## 2018-12-25 NOTE — ED Notes (Signed)
Unsuccessful intubation attempts x 2, bagging pt.

## 2018-12-25 NOTE — Transfer of Care (Signed)
Immediate Anesthesia Transfer of Care Note  Patient: Lucas [name]  Procedure(s) Performed: TRACHEOSTOMY (N/A Neck) control of facial hemorrhaging (Left Face)  Patient Location: ICU  Anesthesia Type:General  Level of Consciousness: sedated and Patient remains intubated per anesthesia plan  Airway & Oxygen Therapy: Patient remains intubated per anesthesia plan and Patient placed on Ventilator (see vital sign flow sheet for setting)  Post-op Assessment: Report given to RN and Post -op Vital signs reviewed and stable  Post vital signs: Reviewed and stable  Last Vitals:  Vitals Value Taken Time  BP 106/70 12/25/2018  4:03 AM  Temp 36.1 C 12/25/2018  4:03 AM  Pulse 98 12/25/2018  4:08 AM  Resp 16 12/25/2018  4:08 AM  SpO2 99 % 12/25/2018  4:08 AM  Vitals shown include unvalidated device data.  Last Pain:  Vitals:   12/25/18 0403  TempSrc: Axillary         Complications: No apparent anesthesia complications

## 2018-12-25 NOTE — Progress Notes (Signed)
   12/25/18 0221  Clinical Encounter Type  Visited With Family;Health care provider  Visit Type Critical Care;Trauma  Referral From Nurse  Consult/Referral To Chaplain  Spiritual Encounters  Spiritual Needs Emotional  Stress Factors  Family Stress Factors Major life changes   Chaplain responded to this Level 1.  PT arrived and was evaluated.  PT's Aunt and Uncle then Father arrived.  Chaplain offered support and allowed space for them to share about the PT and his struggles.  MD's briefed the family and Chaplain continued to offer support and empathetic listening. PT has gone to surgery and Chaplain escorted family to waiting area.  Family is lost right now trying to understand what happened and what the future holds for the PT.  Chaplain available for additional support as needed. Chaplain Agustin Cree

## 2018-12-25 NOTE — Progress Notes (Signed)
Uncle & cousin provided contact numbers for various family members in case of changes. They mentioned that the main contact (father) doesn't like to answer phone calls and provided other ways to contact -   Clovis Cao - Father: [phone] Evern Core - Cousin: [phone] Alexia Freestone - Uncle whom he lives with: [phone] Alex Gardener- Aunt: [phone]
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
## 2018-12-25 NOTE — Progress Notes (Signed)
CRITICAL VALUE ALERT  Critical Value:  PH: 7.225 & C02: 68.8   Date & Time Notied:  12/25/2018 0557  Provider Notified: Trauma MD   Orders Received/Actions taken: Increase respiratory rate from 16 to 20.

## 2018-12-26 ENCOUNTER — Encounter (HOSPITAL_COMMUNITY): Payer: Self-pay | Admitting: Obstetrics and Gynecology

## 2018-12-26 ENCOUNTER — Encounter (HOSPITAL_COMMUNITY): Payer: Self-pay | Admitting: Otolaryngology

## 2018-12-26 LAB — BPAM FFP
Blood Product Expiration Date: 202002092359
Blood Product Expiration Date: 202002092359
Blood Product Expiration Date: 202002222359
Blood Product Expiration Date: 202002222359
ISSUE DATE / TIME: 202002060042
ISSUE DATE / TIME: 202002060042
ISSUE DATE / TIME: 202002070656
ISSUE DATE / TIME: 202002070656
UNIT TYPE AND RH: 6200
Unit Type and Rh: 6200
Unit Type and Rh: 6200
Unit Type and Rh: 6200

## 2018-12-26 LAB — BPAM RBC
BLOOD PRODUCT EXPIRATION DATE: 202003022359
Blood Product Expiration Date: 202002262359
Blood Product Expiration Date: 202002262359
Blood Product Expiration Date: 202003022359
Blood Product Expiration Date: 202003022359
Blood Product Expiration Date: 202003022359
Blood Product Expiration Date: 202003022359
Blood Product Expiration Date: 202003022359
ISSUE DATE / TIME: 202002060042
ISSUE DATE / TIME: 202002060042
ISSUE DATE / TIME: 202002060238
ISSUE DATE / TIME: 202002060238
ISSUE DATE / TIME: 202002060319
ISSUE DATE / TIME: 202002060319
UNIT TYPE AND RH: 6200
UNIT TYPE AND RH: 6200
Unit Type and Rh: 5100
Unit Type and Rh: 5100
Unit Type and Rh: 6200
Unit Type and Rh: 6200
Unit Type and Rh: 6200
Unit Type and Rh: 6200

## 2018-12-26 LAB — TYPE AND SCREEN
ABO/RH(D): A POS
Antibody Screen: NEGATIVE
UNIT DIVISION: 0
UNIT DIVISION: 0
Unit division: 0
Unit division: 0
Unit division: 0
Unit division: 0
Unit division: 0
Unit division: 0

## 2018-12-26 LAB — PREPARE FRESH FROZEN PLASMA
UNIT DIVISION: 0
Unit division: 0
Unit division: 0
Unit division: 0

## 2018-12-26 MED ORDER — GENERIC EXTERNAL MEDICATION
Status: DC
Start: ? — End: 2018-12-26

## 2018-12-26 MED ORDER — K-PHOS-NEUTRAL 155-852-130 MG PO TABS
500.00 | ORAL_TABLET | ORAL | Status: DC
Start: 2018-12-26 — End: 2018-12-26

## 2018-12-26 MED ORDER — GENERIC EXTERNAL MEDICATION
500.00 | Status: DC
Start: 2018-12-26 — End: 2018-12-26

## 2018-12-26 MED ORDER — IPRATROPIUM-ALBUTEROL 0.5-2.5 (3) MG/3ML IN SOLN
3.00 | RESPIRATORY_TRACT | Status: DC
Start: ? — End: 2018-12-26

## 2018-12-26 MED ORDER — RISPERIDONE 1 MG PO TABS
1.00 | ORAL_TABLET | ORAL | Status: DC
Start: 2018-12-26 — End: 2018-12-26

## 2018-12-26 MED ORDER — POTASSIUM CHLORIDE 20 MEQ PO PACK
20.00 | PACK | ORAL | Status: DC
Start: 2018-12-26 — End: 2018-12-26

## 2018-12-26 MED ORDER — MOXIFLOXACIN HCL IN NACL 400 MG/250ML IV SOLN
400.00 | INTRAVENOUS | Status: DC
Start: 2018-12-26 — End: 2018-12-26

## 2018-12-26 MED ORDER — TRAMADOL HCL 50 MG PO TABS
50.00 | ORAL_TABLET | ORAL | Status: DC
Start: ? — End: 2018-12-26

## 2018-12-26 MED ORDER — TOBRAMYCIN-DEXAMETHASONE 0.3-0.1 % OP OINT
.50 | TOPICAL_OINTMENT | OPHTHALMIC | Status: DC
Start: 2019-01-05 — End: 2018-12-26

## 2018-12-26 MED ORDER — LEVETIRACETAM 500 MG PO TABS
500.00 | ORAL_TABLET | ORAL | Status: DC
Start: 2018-12-26 — End: 2018-12-26

## 2018-12-26 MED ORDER — OXYCODONE-ACETAMINOPHEN 5-325 MG PO TABS
1.00 | ORAL_TABLET | ORAL | Status: DC
Start: ? — End: 2018-12-26

## 2018-12-26 MED ORDER — GABAPENTIN 100 MG PO CAPS
100.00 | ORAL_CAPSULE | ORAL | Status: DC
Start: 2018-12-27 — End: 2018-12-26

## 2018-12-26 MED ORDER — MAGNESIUM OXIDE 400 MG PO TABS
400.00 | ORAL_TABLET | ORAL | Status: DC
Start: 2018-12-26 — End: 2018-12-26

## 2018-12-27 MED ORDER — ENOXAPARIN SODIUM 30 MG/0.3ML ~~LOC~~ SOLN
30.00 | SUBCUTANEOUS | Status: DC
Start: 2019-02-04 — End: 2018-12-27

## 2018-12-27 MED ORDER — GENERIC EXTERNAL MEDICATION
500.00 | Status: DC
Start: 2018-12-27 — End: 2018-12-27

## 2018-12-27 MED ORDER — GENERIC EXTERNAL MEDICATION
2.00 | Status: DC
Start: 2018-12-27 — End: 2018-12-27

## 2018-12-27 MED ORDER — RISPERIDONE 1 MG PO TABS
2.00 | ORAL_TABLET | ORAL | Status: DC
Start: 2018-12-27 — End: 2018-12-27

## 2018-12-27 MED ORDER — GENERIC EXTERNAL MEDICATION
5.00 | Status: DC
Start: ? — End: 2018-12-27

## 2018-12-27 MED ORDER — RISPERIDONE 1 MG PO TBDP
2.00 | ORAL_TABLET | ORAL | Status: DC
Start: 2018-12-27 — End: 2018-12-27

## 2018-12-27 MED ORDER — FAMOTIDINE 20 MG/2ML IV SOLN
20.00 | INTRAVENOUS | Status: DC
Start: 2018-12-27 — End: 2018-12-27

## 2018-12-27 MED ORDER — HYDROMORPHONE HCL PF 1 MG/ML IJ SOLN
.20 | INTRAMUSCULAR | Status: DC
Start: ? — End: 2018-12-27

## 2019-01-05 MED ORDER — OXYCODONE-ACETAMINOPHEN 5-325 MG PO TABS
1.00 | ORAL_TABLET | ORAL | Status: DC
Start: ? — End: 2019-01-05

## 2019-01-05 MED ORDER — HYDROXYZINE HCL 25 MG PO TABS
25.00 | ORAL_TABLET | ORAL | Status: DC
Start: ? — End: 2019-01-05

## 2019-01-05 MED ORDER — HALOPERIDOL 5 MG PO TABS
5.00 | ORAL_TABLET | ORAL | Status: DC
Start: ? — End: 2019-01-05

## 2019-01-05 MED ORDER — CHLORHEXIDINE GLUCONATE 0.12 % MT SOLN
15.00 | OROMUCOSAL | Status: DC
Start: ? — End: 2019-01-05

## 2019-01-05 MED ORDER — LACTATED RINGERS IV SOLN
INTRAVENOUS | Status: DC
Start: ? — End: 2019-01-05

## 2019-01-05 MED ORDER — DIPHENHYDRAMINE HCL 50 MG/ML IJ SOLN
50.00 | INTRAMUSCULAR | Status: DC
Start: ? — End: 2019-01-05

## 2019-01-05 MED ORDER — CLONIDINE HCL 0.1 MG PO TABS
0.30 | ORAL_TABLET | ORAL | Status: DC
Start: 2019-01-05 — End: 2019-01-05

## 2019-01-05 MED ORDER — GABAPENTIN 300 MG PO CAPS
300.00 | ORAL_CAPSULE | ORAL | Status: DC
Start: 2019-02-04 — End: 2019-01-05

## 2019-01-05 MED ORDER — GENERIC EXTERNAL MEDICATION
0.00 | Status: DC
Start: ? — End: 2019-01-05

## 2019-01-05 MED ORDER — GENERIC EXTERNAL MEDICATION
5.00 | Status: DC
Start: ? — End: 2019-01-05

## 2019-01-05 MED ORDER — AMOXICILLIN-POT CLAVULANATE 875-125 MG PO TABS
875.00 | ORAL_TABLET | ORAL | Status: DC
Start: 2019-01-05 — End: 2019-01-05

## 2019-01-05 MED ORDER — IPRATROPIUM-ALBUTEROL 0.5-2.5 (3) MG/3ML IN SOLN
3.00 | RESPIRATORY_TRACT | Status: DC
Start: 2019-01-05 — End: 2019-01-05

## 2019-01-05 MED ORDER — ASPIRIN 325 MG PO TABS
325.00 | ORAL_TABLET | ORAL | Status: DC
Start: 2019-01-05 — End: 2019-01-05

## 2019-01-05 MED ORDER — FAMOTIDINE 20 MG PO TABS
20.00 | ORAL_TABLET | ORAL | Status: DC
Start: 2019-01-05 — End: 2019-01-05

## 2019-01-05 MED ORDER — VALPROIC ACID 250 MG/5ML PO SOLN
500.00 | ORAL | Status: DC
Start: 2019-01-05 — End: 2019-01-05

## 2019-01-05 MED ORDER — QUETIAPINE FUMARATE 100 MG PO TABS
100.00 | ORAL_TABLET | ORAL | Status: DC
Start: 2019-01-05 — End: 2019-01-05

## 2019-01-05 MED ORDER — GENERIC EXTERNAL MEDICATION
Status: DC
Start: 2019-01-05 — End: 2019-01-05

## 2019-01-05 MED ORDER — GENERIC EXTERNAL MEDICATION
Status: DC
Start: ? — End: 2019-01-05

## 2019-01-05 MED ORDER — CHLORHEXIDINE GLUCONATE 0.12 % MT SOLN
15.00 | OROMUCOSAL | Status: DC
Start: 2019-01-05 — End: 2019-01-05

## 2019-01-05 NOTE — Anesthesia Postprocedure Evaluation (Signed)
Anesthesia Post Note  Patient: Lucas [name]  Procedure(s) Performed: TRACHEOSTOMY (N/A Neck) control of facial hemorrhaging (Left Face)     Patient location during evaluation: SICU Anesthesia Type: General Level of consciousness: sedated Pain management: pain level controlled Vital Signs Assessment: post-procedure vital signs reviewed and stable Respiratory status: patient connected to tracheostomy mask oxygen Cardiovascular status: stable Postop Assessment: no apparent nausea or vomiting Anesthetic complications: no    Last Vitals:  Vitals:   12/25/18 1630 12/25/18 1743  BP: 122/76 119/76  Pulse: 98 82  Resp: (!) 22 (!) 21  Temp: 37.7 C 37.7 C  SpO2: 100% 100%    Last Pain:  Vitals:   12/25/18 1430  TempSrc: Bladder                 [name]

## 2019-01-27 MED ORDER — CHLORHEXIDINE GLUCONATE 0.12 % MT SOLN
15.00 | OROMUCOSAL | Status: DC
Start: 2019-02-04 — End: 2019-01-27

## 2019-01-27 MED ORDER — HYDROXYZINE HCL 25 MG PO TABS
50.00 | ORAL_TABLET | ORAL | Status: DC
Start: 2019-02-04 — End: 2019-01-27

## 2019-01-27 MED ORDER — BACITRACIN ZINC 500 UNIT/GM EX OINT
TOPICAL_OINTMENT | CUTANEOUS | Status: DC
Start: 2019-02-04 — End: 2019-01-27

## 2019-01-27 MED ORDER — MELATONIN 3 MG PO TABS
6.00 | ORAL_TABLET | ORAL | Status: DC
Start: 2019-02-04 — End: 2019-01-27

## 2019-01-27 MED ORDER — IPRATROPIUM-ALBUTEROL 0.5-2.5 (3) MG/3ML IN SOLN
3.00 | RESPIRATORY_TRACT | Status: DC
Start: ? — End: 2019-01-27

## 2019-01-27 MED ORDER — ERYTHROMYCIN 5 MG/GM OP OINT
TOPICAL_OINTMENT | OPHTHALMIC | Status: DC
Start: 2019-01-27 — End: 2019-01-27

## 2019-01-27 MED ORDER — GENERIC EXTERNAL MEDICATION
1.00 | Status: DC
Start: ? — End: 2019-01-27

## 2019-01-27 MED ORDER — VALPROIC ACID 250 MG/5ML PO SOLN
1000.00 | ORAL | Status: DC
Start: 2019-02-04 — End: 2019-01-27

## 2019-01-27 MED ORDER — MOXIFLOXACIN HCL 0.5 % OP SOLN
1.00 | OPHTHALMIC | Status: DC
Start: 2019-01-27 — End: 2019-01-27

## 2019-01-27 MED ORDER — CLINDAMYCIN HCL 150 MG PO CAPS
450.00 | ORAL_CAPSULE | ORAL | Status: DC
Start: 2019-02-04 — End: 2019-01-27

## 2019-01-27 MED ORDER — OXYCODONE-ACETAMINOPHEN 5-325 MG PO TABS
1.00 | ORAL_TABLET | ORAL | Status: DC
Start: ? — End: 2019-01-27

## 2019-01-27 MED ORDER — ASPIRIN 81 MG PO CHEW
324.00 | CHEWABLE_TABLET | ORAL | Status: DC
Start: 2019-02-05 — End: 2019-01-27

## 2019-01-27 MED ORDER — RISPERIDONE 1 MG PO TABS
2.00 | ORAL_TABLET | ORAL | Status: DC
Start: 2019-02-04 — End: 2019-01-27

## 2019-01-27 MED ORDER — GENERIC EXTERNAL MEDICATION
Status: DC
Start: 2019-02-04 — End: 2019-01-27

## 2019-02-04 MED ORDER — HYDROCODONE-ACETAMINOPHEN 5-325 MG PO TABS
1.00 | ORAL_TABLET | ORAL | Status: DC
Start: ? — End: 2019-02-04

## 2019-02-04 MED ORDER — TOBRAMYCIN-DEXAMETHASONE 0.3-0.1 % OP OINT
0.50 | TOPICAL_OINTMENT | OPHTHALMIC | Status: DC
Start: 2019-02-04 — End: 2019-02-04

## 2019-10-08 IMAGING — CR DG CHEST 2V
2 series · 2 of 2 positions shown · non-contrast
Comparison: None.

CLINICAL DATA: 37-year-old who took an unknown substance earlier
and is currently with acute mental status changes.

EXAM:
CHEST - 2 VIEW

[w chest pa]
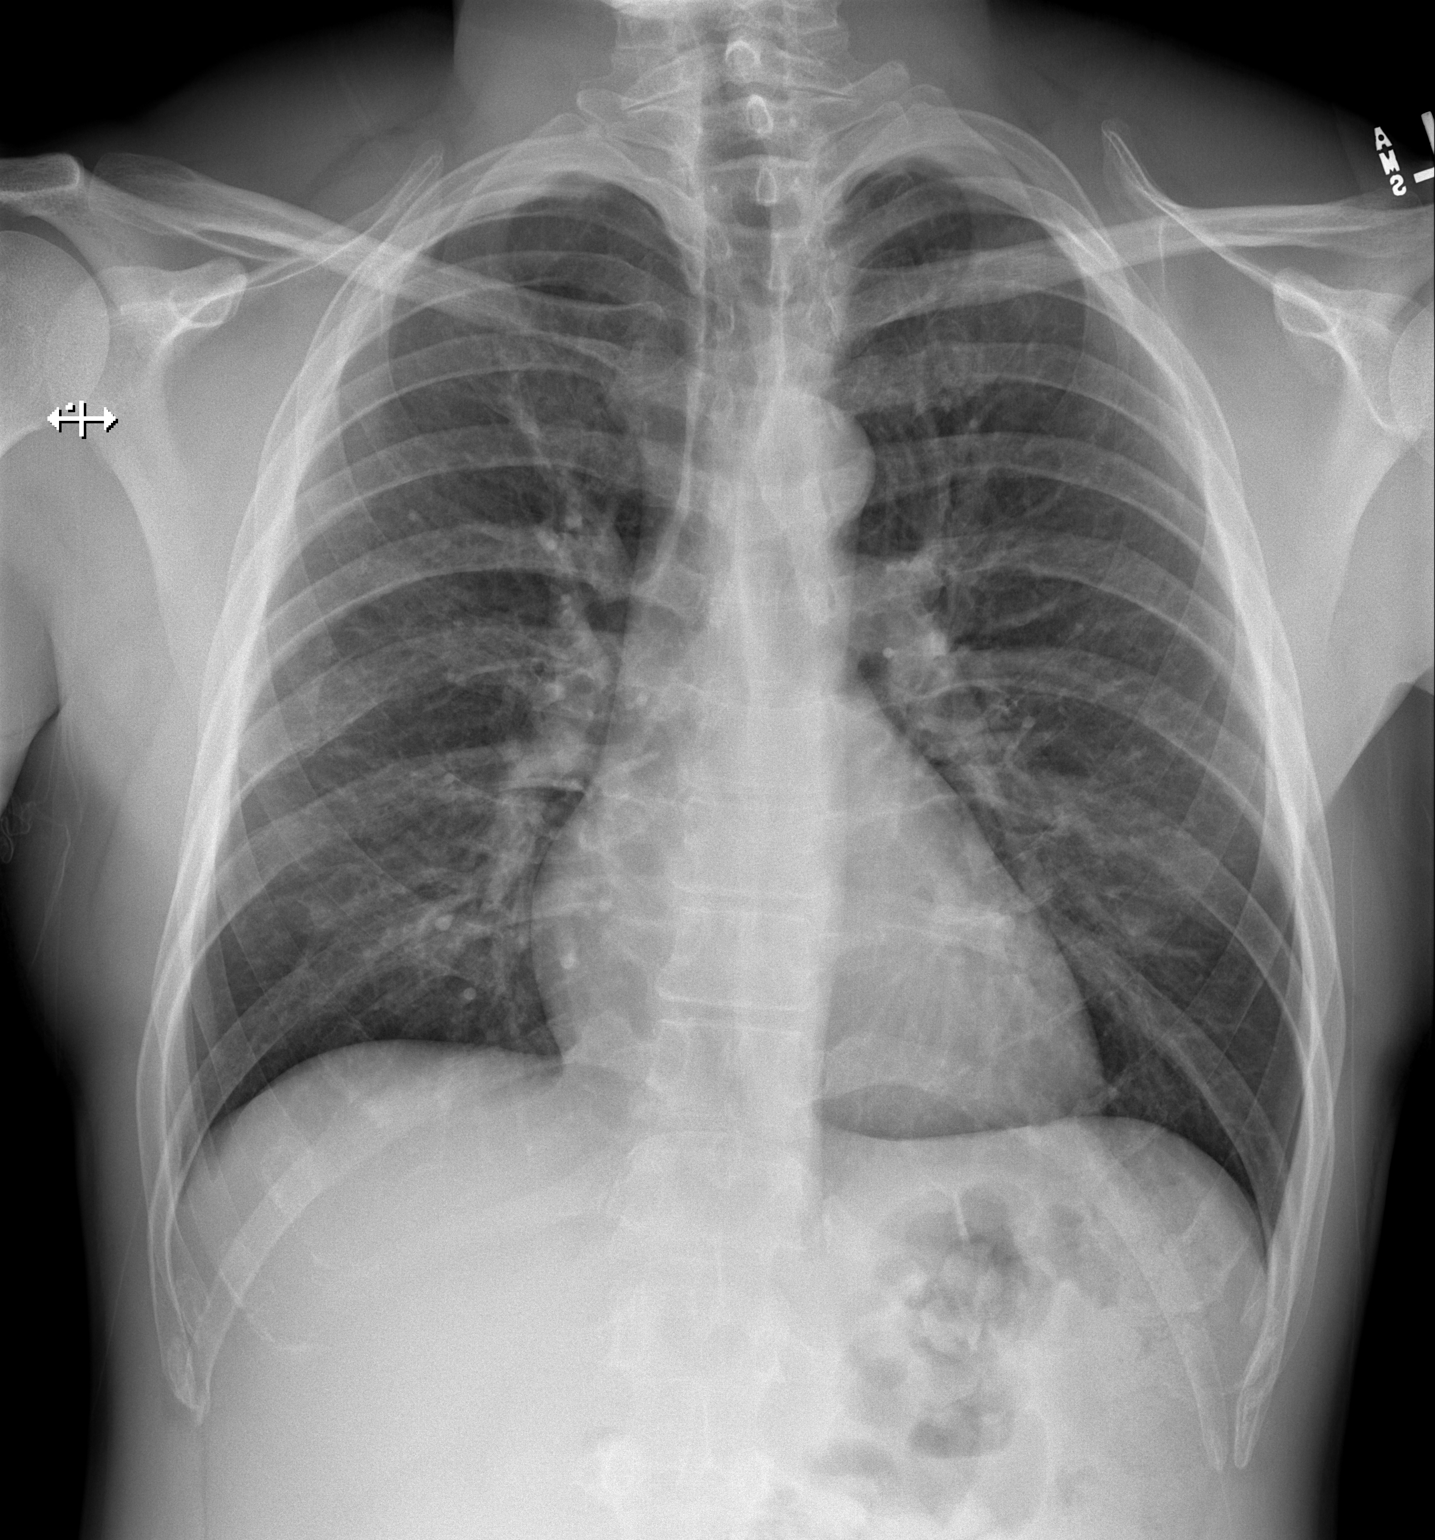

[w chest lat]
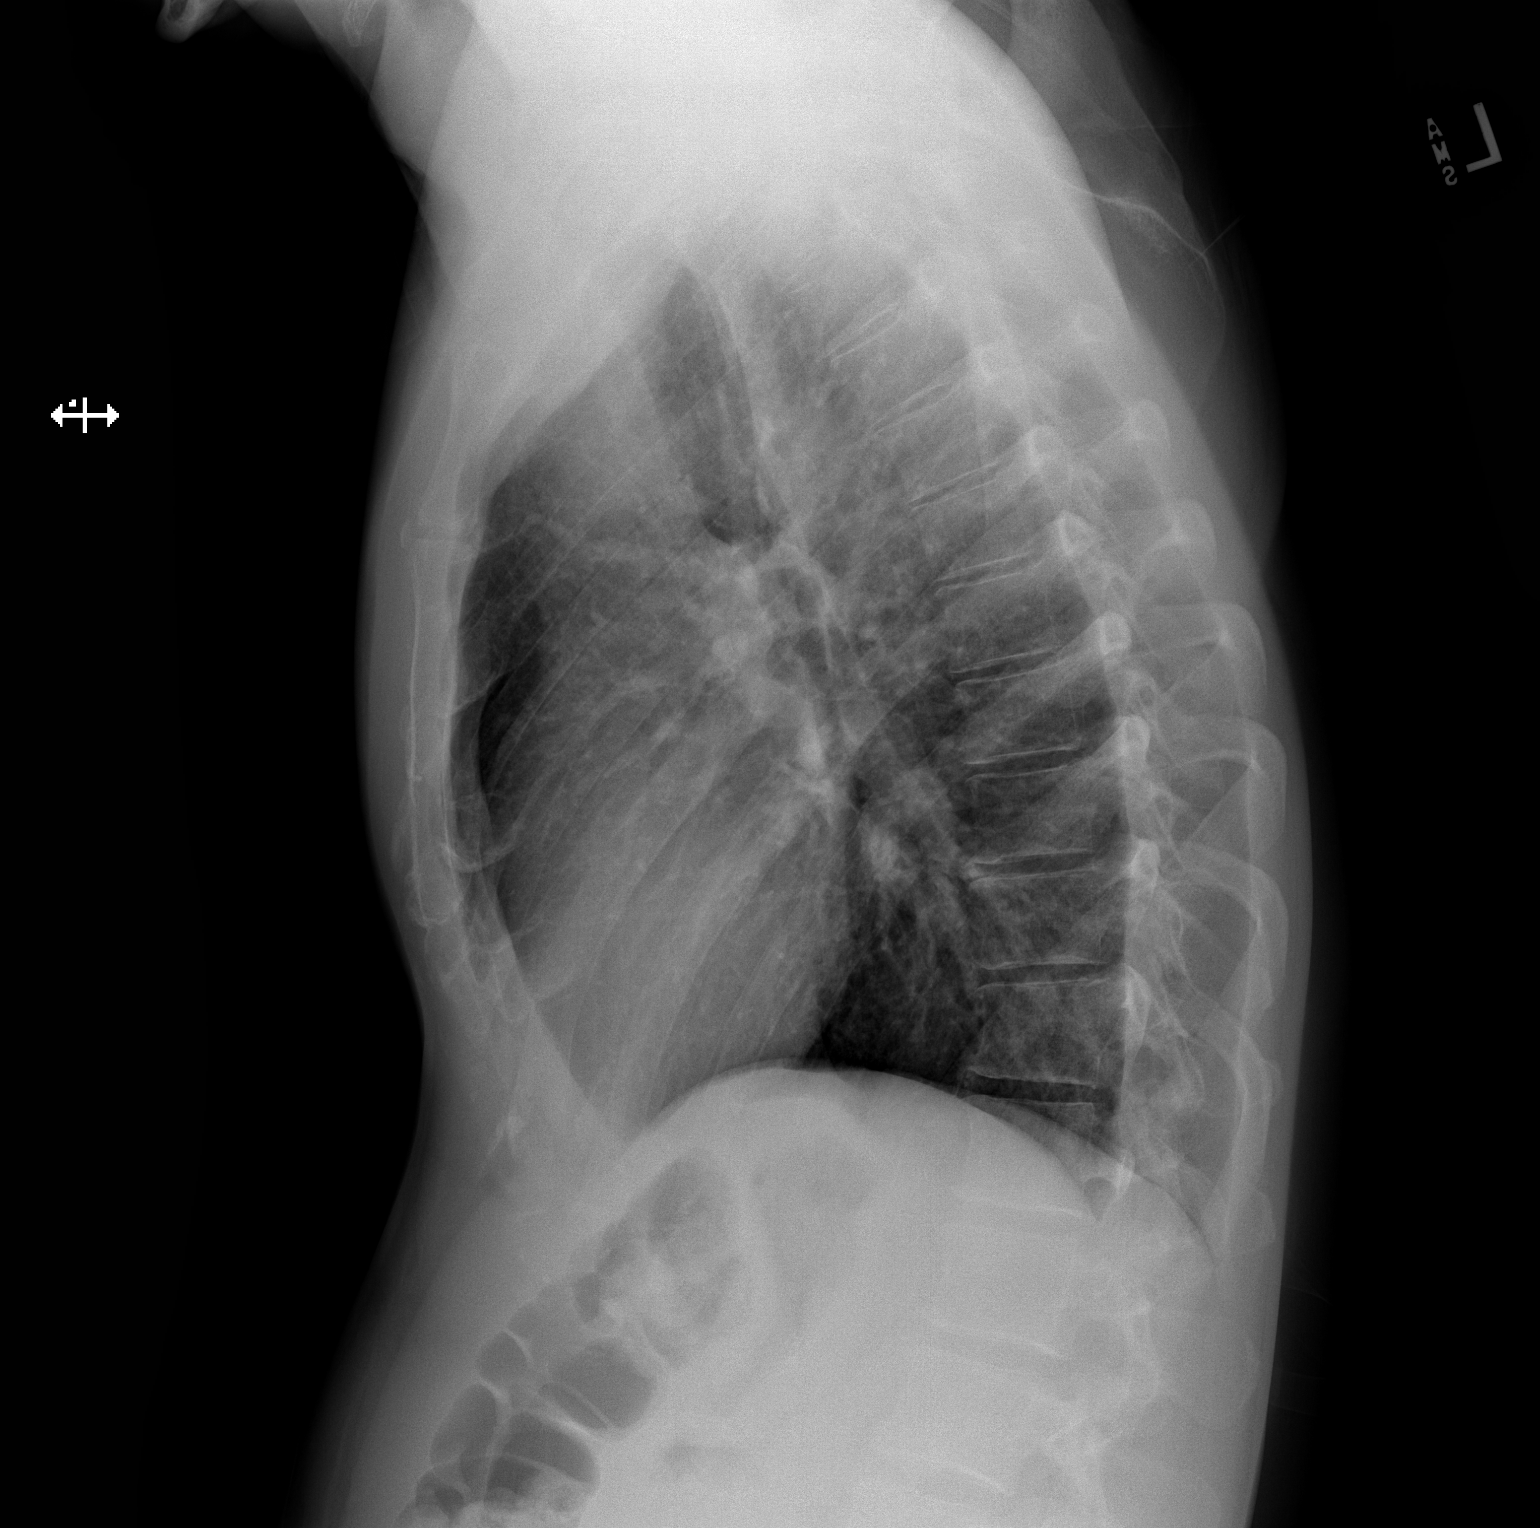

[2 of 2 positions shown; findings below may reference images not displayed]

FINDINGS: Cardiomediastinal silhouette unremarkable. Moderate central
peribronchial thickening. Lungs otherwise clear. No localized
airspace consolidation. No pleural effusions. No pneumothorax.
Normal pulmonary vascularity. Visualized bony thorax intact.
IMPRESSION: Moderate central peribronchial thickening likely indicating
bronchitis and/or asthma. No acute cardiopulmonary disease
otherwise.

## 2022-05-18 ENCOUNTER — Other Ambulatory Visit: Payer: Self-pay | Admitting: Dermatology

## 2022-05-31 ENCOUNTER — Ambulatory Visit: Payer: BLUE CROSS/BLUE SHIELD | Admitting: Dermatology

## 2022-06-01 DIAGNOSIS — C44311 Basal cell carcinoma of skin of nose: Secondary | ICD-10-CM

## 2022-06-01 LAB — DERMATOLOGY PATHOLOGY SLIDE CONSULT

## 2022-07-08 NOTE — Progress Notes (Deleted)
07/13/2022    Consultation for Mohs surgery    We had the pleasure of seeing Rolla Kedzierski in consultation for Mohs Micrographic Surgery.  Mr. Lipkin is a 41yrold male, referred by  outside facility , for biopsy-proven basal cell carcinoma on the right bridge of nose.       Biopsy 04/17/22 ([phone] [place] slide review performed on 05/31/22):  SKIN, RIGHT BRIDGE OR NOSE, (SHAVE BIOPSY; OUTSIDE SLIDE FORM 04/17/22)              BASAL CELL CARCINOMA, NODULAR PATTERN, PIGMENTED    Past Medical History: No past medical history on file.  HIV history: ***  Hepatitis history: ***    Blood thinners: ***  Antibiotic Prophylaxis: ***  Medical implants: ***  Allergies: Patient has no allergy information on record.    Exam:   There were no vitals taken for this visit.  The patient's vital signs were noted. The patient appeared to be oriented to time, place, and person.   - ***    Assessment/Plan:  Biopsy-proven basal cell carcinoma of the right bridge of nose  - ***I examined the biopsy site, which the patient pointed out, and found an ill-defined papule on the above specified location.  ***I marked the area(s) with a surgical marking pen and confirmed the correct site with the patient.  ***I photographed the lesion to aid with future site localization.  ***I reviewed the photograph taken at the time of the biopsy available in EMR and deemed it to be sufficient to assist with future lesion site localization.  - ***I explained the nature of this skin cancer and the potential advantages of the Mohs technique.  ***I described the various steps involved in Mohs micrographic surgery.  I also explained alternative treatments, such as excision, electrodessication and curettage, topical therapy (imiqumod vs fluorouracil), and radiation therapy, all of which offer inferior outcomes such as worsened scar and/or lower cure rate.  I discussed the risks of Mohs micrographic surgery including certainty of scar, in addition to bleeding,  infection, nerve damage, failure to cure the tumor, recurrence, and failure of the reconstruction.    Possible reconstruction outcomes and aftercare wound instructions/activity restrictions also reviewed.  Given the location and size of skin cancer and the corresponding defect size anticipated after tumor extirpation, ***  The patient expressed understanding and wished to proceed with Mohs micrographic surgery.  - Mohs surgery referral order ***already placed.   Patient was told Mohs scheduler will be in contact with *** in the near future to schedule the date of *** surgery.  - Patient's risk factors for surgery: ***    In summary, we agree with you that the high-risk location and clinically ill-defined margins of this tumor are indications for Mohs micrographic surgery.     Thank you very much for this consultation.     Learning barriers: Educational needs assessed and there were no barriers to learning.    Follow-up:   Next available Mohs Surgery clinic slot  As directed by referring dermatologist    Approximately *** minutes were spent with the patient, of which more than 50% was spent counseling and/or coordinating care on ***. The patient understands and agrees with the plan of care as outlined.       Time Spent Today on Evaluation and Management Service:    > includes pre-visit chart review, post visit documentation, etc.   > Excludes procedural time   DERM TIME VERBIAGE:[id number]     IDonney Dice  MD  [facility], PGY III

## 2022-07-13 ENCOUNTER — Ambulatory Visit: Payer: BLUE CROSS/BLUE SHIELD | Admitting: Dermatology

## 2022-07-13 DIAGNOSIS — C44311 Basal cell carcinoma of skin of nose: Secondary | ICD-10-CM

## 2023-06-29 ENCOUNTER — Other Ambulatory Visit: Payer: Self-pay

## 2023-06-29 ENCOUNTER — Encounter (HOSPITAL_COMMUNITY): Payer: Self-pay

## 2023-06-29 ENCOUNTER — Emergency Department (HOSPITAL_COMMUNITY)
Admission: EM | Admit: 2023-06-29 | Discharge: 2023-06-30 | Disposition: A | Discharge status: Home / Self Care | Payer: Medicare Other | Source: Home / Self Care | Attending: Emergency Medicine | Admitting: Emergency Medicine

## 2023-06-29 ENCOUNTER — Emergency Department (HOSPITAL_COMMUNITY)
Admission: EM | Admit: 2023-06-29 | Discharge: 2023-06-29 | Disposition: A | Discharge status: Home / Self Care | Payer: Medicare Other | Attending: Emergency Medicine | Admitting: Emergency Medicine

## 2023-06-29 ENCOUNTER — Emergency Department (HOSPITAL_COMMUNITY): Payer: Medicare Other

## 2023-06-29 DIAGNOSIS — F1590 Other stimulant use, unspecified, uncomplicated: Secondary | ICD-10-CM

## 2023-06-29 DIAGNOSIS — F17210 Nicotine dependence, cigarettes, uncomplicated: Secondary | ICD-10-CM | POA: Insufficient documentation

## 2023-06-29 DIAGNOSIS — R002 Palpitations: Secondary | ICD-10-CM | POA: Insufficient documentation

## 2023-06-29 DIAGNOSIS — F209 Schizophrenia, unspecified: Secondary | ICD-10-CM | POA: Insufficient documentation

## 2023-06-29 DIAGNOSIS — R45851 Suicidal ideations: Secondary | ICD-10-CM | POA: Insufficient documentation

## 2023-06-29 DIAGNOSIS — F1994 Other psychoactive substance use, unspecified with psychoactive substance-induced mood disorder: Secondary | ICD-10-CM | POA: Insufficient documentation

## 2023-06-29 DIAGNOSIS — T6594XA Toxic effect of unspecified substance, undetermined, initial encounter: Secondary | ICD-10-CM

## 2023-06-29 DIAGNOSIS — F1910 Other psychoactive substance abuse, uncomplicated: Secondary | ICD-10-CM

## 2023-06-29 DIAGNOSIS — Z5941 Food insecurity: Secondary | ICD-10-CM | POA: Insufficient documentation

## 2023-06-29 DIAGNOSIS — Z5986 Financial insecurity: Secondary | ICD-10-CM | POA: Insufficient documentation

## 2023-06-29 DIAGNOSIS — Z5902 Unsheltered homelessness: Secondary | ICD-10-CM | POA: Diagnosis not present

## 2023-06-29 DIAGNOSIS — Z5982 Transportation insecurity: Secondary | ICD-10-CM | POA: Diagnosis not present

## 2023-06-29 DIAGNOSIS — Z794 Long term (current) use of insulin: Secondary | ICD-10-CM | POA: Diagnosis not present

## 2023-06-29 LAB — URINALYSIS, ROUTINE W REFLEX MICROSCOPIC
Bilirubin Urine: NEGATIVE
Glucose, UA: NEGATIVE mg/dL
Hgb urine dipstick: NEGATIVE
Ketones, ur: NEGATIVE mg/dL
Leukocytes,Ua: NEGATIVE
Nitrite: NEGATIVE
Protein, ur: NEGATIVE mg/dL
Specific Gravity, Urine: 1.024 (ref 1.005–1.030)
pH: 6 (ref 5.0–8.0)

## 2023-06-29 LAB — CBC WITH DIFFERENTIAL/PLATELET
Abs Immature Granulocytes: 0.02 10*3/uL (ref 0.00–0.07)
Basophils Absolute: 0.1 10*3/uL (ref 0.0–0.1)
Basophils Relative: 1 %
Eosinophils Absolute: 0.1 10*3/uL (ref 0.0–0.5)
Eosinophils Relative: 1 %
HCT: 42.3 % (ref 39.0–52.0)
Hemoglobin: 14.4 g/dL (ref 13.0–17.0)
Immature Granulocytes: 0 %
Lymphocytes Relative: 11 %
Lymphs Abs: 1 10*3/uL (ref 0.7–4.0)
MCH: 31 pg (ref 26.0–34.0)
MCHC: 34 g/dL (ref 30.0–36.0)
MCV: 91 fL (ref 80.0–100.0)
Monocytes Absolute: 0.7 10*3/uL (ref 0.1–1.0)
Monocytes Relative: 8 %
Neutro Abs: 7.6 10*3/uL (ref 1.7–7.7)
Neutrophils Relative %: 79 %
Platelets: 264 10*3/uL (ref 150–400)
RBC: 4.65 MIL/uL (ref 4.22–5.81)
RDW: 13.1 % (ref 11.5–15.5)
WBC: 9.6 10*3/uL (ref 4.0–10.5)
nRBC: 0 % (ref 0.0–0.2)

## 2023-06-29 LAB — RAPID URINE DRUG SCREEN, HOSP PERFORMED
Amphetamines: POSITIVE — AB
Barbiturates: NOT DETECTED
Benzodiazepines: POSITIVE — AB
Cocaine: NOT DETECTED
Opiates: NOT DETECTED
Tetrahydrocannabinol: POSITIVE — AB

## 2023-06-29 LAB — COMPREHENSIVE METABOLIC PANEL
ALT: 18 U/L (ref 0–44)
AST: 19 U/L (ref 15–41)
Albumin: 3.9 g/dL (ref 3.5–5.0)
Alkaline Phosphatase: 61 U/L (ref 38–126)
Anion gap: 9 (ref 5–15)
BUN: 14 mg/dL (ref 6–20)
CO2: 27 mmol/L (ref 22–32)
Calcium: 8.7 mg/dL — ABNORMAL LOW (ref 8.9–10.3)
Chloride: 104 mmol/L (ref 98–111)
Creatinine, Ser: 1.03 mg/dL (ref 0.61–1.24)
GFR, Estimated: >60 mL/min (ref >60)
Glucose, Bld: 99 mg/dL (ref 70–99)
Potassium: 3.3 mmol/L — ABNORMAL LOW (ref 3.5–5.1)
Sodium: 140 mmol/L (ref 135–145)
Total Bilirubin: 0.6 mg/dL (ref 0.3–1.2)
Total Protein: 6.7 g/dL (ref 6.5–8.1)

## 2023-06-29 LAB — TROPONIN I (HIGH SENSITIVITY): Troponin I (High Sensitivity): 3 ng/L (ref <18)

## 2023-06-29 LAB — ETHANOL: Alcohol, Ethyl (B): <10 mg/dL (ref <10)

## 2023-06-29 LAB — ACETAMINOPHEN LEVEL: Acetaminophen (Tylenol), Serum: <10 ug/mL — ABNORMAL LOW (ref 10–30)

## 2023-06-29 LAB — SALICYLATE LEVEL: Salicylate Lvl: <7 mg/dL — ABNORMAL LOW (ref 7.0–30.0)

## 2023-06-29 LAB — D-DIMER, QUANTITATIVE: D-Dimer, Quant: <0.27 ug/mL-FEU (ref 0.00–0.50)

## 2023-06-29 MED ORDER — ZIPRASIDONE MESYLATE 20 MG IM SOLR
20.0000 mg | Freq: Once | INTRAMUSCULAR | Status: AC
  Administered 2023-06-29: 20 mg via INTRAMUSCULAR
  Filled 2023-06-29: qty 20

## 2023-06-29 MED ORDER — LORAZEPAM 2 MG/ML IJ SOLN
2.0000 mg | Freq: Once | INTRAMUSCULAR | Status: AC
  Administered 2023-06-29: 2 mg via INTRAMUSCULAR
  Filled 2023-06-29: qty 1

## 2023-06-29 MED ORDER — STERILE WATER FOR INJECTION IJ SOLN
INTRAMUSCULAR | Status: AC
  Administered 2023-06-29: 1.2 mL via INTRAMUSCULAR
  Filled 2023-06-29: qty 10

## 2023-06-29 MED ORDER — DIPHENHYDRAMINE HCL 50 MG/ML IJ SOLN
50.0000 mg | Freq: Once | INTRAMUSCULAR | Status: AC
  Administered 2023-06-29: 50 mg via INTRAMUSCULAR
  Filled 2023-06-29: qty 1

## 2023-06-29 NOTE — Discharge Instructions (Signed)

## 2023-06-29 NOTE — ED Notes (Signed)
Report received, care of pt assumed.  Pt resting quietly on ER stretcher, eyes closed, respirations even and non labored.
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
## 2023-06-29 NOTE — ED Notes (Signed)
Offered to throw pt tray away. Pt states "no keep the tray they keep giving something that is making me feel weird why do I feel like this". Rasing voice at RN stating that " if you don't help me that is against the law, I feel sick, can I go call my lawyer"  Pt also states "can you get my medication kratom out my book bag" I advised pt we can not do that because  he is not prescribed with kratom.
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
## 2023-06-29 NOTE — ED Notes (Signed)
Pt now awake and alert, TTS NP notified, will be over to see pt shortly.
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
## 2023-06-29 NOTE — ED Provider Notes (Signed)
Emergency Medicine Observation Re-evaluation Note  Lucas [name] is a 42 y.o. male, seen on rounds today.  Pt initially presented to the ED for complaints of Ingestion Currently, the patient is resting comfortably.  Physical Exam  BP 104/69   Pulse 80   Temp 98 F (36.7 C)   Resp 16   Ht 5\' 5"  (1.651 m)   Wt 67.1 kg   SpO2 96%   BMI 24.63 kg/m  Physical Exam Vitals and nursing note reviewed.  Constitutional:      General: He is not in acute distress.    Appearance: He is well-developed.  HENT:     Head: Normocephalic and atraumatic.  Eyes:     Conjunctiva/sclera: Conjunctivae normal.  Cardiovascular:     Rate and Rhythm: Normal rate and regular rhythm.     Heart sounds: No murmur heard. Pulmonary:     Effort: Pulmonary effort is normal. No respiratory distress.  Musculoskeletal:        General: No swelling.     Cervical back: Neck supple.  Skin:    General: Skin is warm and dry.  Neurological:     Mental Status: He is alert.  Psychiatric:        Mood and Affect: Mood normal.     ED Course / MDM  EKG:EKG Interpretation Date/Time:  Saturday June 29 2023 01:32:30 EDT Ventricular Rate:  115 PR Interval:  146 QRS Duration:  76 QT Interval:  324 QTC Calculation: 449 R Axis:   68  Text Interpretation: Sinus tachycardia Paired ventricular premature complexes Aberrant conduction of SV complex(es) No previous ECGs available Confirmed by Glynn Octave [phone]) on 06/29/2023 1:51:36 AM  I have reviewed the labs performed to date as well as medications administered while in observation.  Recent changes in the last 24 hours include TTS evaluation, psych cleared.  Plan  Current plan is for dc.    Glendora Score, MD 06/29/23 [phone]

## 2023-06-29 NOTE — Consult Note (Signed)
BH ED ASSESSMENT   Reason for Consult:  suicidal  Referring Physician: Dr. Manus Gunning  Patient Identification: Lucas [name] MRN:  [id number] ED Chief Complaint: Substance induced mood disorder (HCC)  Diagnosis:  Principal Problem:   Substance induced mood disorder [facility])   ED Assessment Time Calculation: Start Time: 1400 Stop Time: 1430 Total Time in Minutes (Assessment Completion): 30   Subjective: Lucas [name] is a 42 y.o. male patient admitted after ingesting a pill he thought was adderall from a homeless man. Lucas [name] is a 42 y.o. male who has a past medical history of Anxiety, Bipolar 1 disorder (HCC), Depression, and Schizophrenia (HCC).      HPI: Lucas [name], 42 y.o., male patient seen face to face by this provider, consulted with Dr. Clovis Riley; and chart reviewed on 06/29/23.  On evaluation Lucas [name] reports that he knows a guy who sells "pills "stated that he usually gets Vyvanse from the guy, this is a "homeless guy", off the street he knows, states that he tries to guy,, this time he thought he was buying Adderall but felt like the guy gave him a "poisonous pill."Patient states he does not know what the pill was, said he began to have chest pains, and sweating.  He currently denies SI/HI/AVH, states he needs to be discharged, he has some business to take care of dealing with his family.  He feels like he does not need to be admitted to an inpatient facility, says he is not a danger to self or anybody else, he knows it was the substance that made him feel anxious, agitated, and sweaty.  Patient endorses using kratom drug and THC.  Patient UDS positive for amphetamines, benzodiazepines, and THC.  BAL less than 10.  Patient states that he does live on the streets, he stays in a tent in front of a church on [address], states he is going through a legal battle with his family for a home that he has purchased that he is  trying to move back into.  Patient states he is compliant with his medications, which is Abilify, he is unsure of the dosage.  Patient states he also sees Lonie Peak, FNP at [facility] for his outpatient resources and therapy.  On assessment, patient was calm and cooperative for interview process. His eye contact was good. Psychomotor activity was normal. Patient describes his mood as better and his affect is congruent with mood. His thought process was normal and he answered questions appropriately, he did not display any flight of ideas. Associations were intact. There were no overt signs of delusions, paranoia, or responding to internal stimuli. Patient's insight and judgment are fair, chronically due to underlying mental illness. Patient is able to contract for safety outside the hospital. At this time, the patient denies any suicidal ideations, homicidal ideations, auditory hallucinations, visual hallucinations, in addition to denying any paranoia or delusions apparent otherwise. Lastly, patient denies any elevated mood or euphoria that would be concerning for mania. At the conclusion of the evaluation, the patient voiced no other concerns.  Attempted to call patient father Lucas [name], no answer, left a HIPAA compliant voicemail.  At this time Lucas [name] is educated and verbalizes understanding of mental health resources and other crisis services in the community. He is instructed to call 911 and present to the nearest emergency room should he experience any suicidal/homicidal ideation, auditory/visual/hallucinations, or detrimental worsening of his mental health condition.   Past Psychiatric History:  Anxiety, Bipolar 1 disorder (HCC), Depression, and Schizophrenia (HCC).   Risk to Self or Others: Risk to Self:  No Risk to Others:  No  Prior Inpatient Therapy:  Yes  Prior Outpatient Therapy: Yes   [place] Scale:  Flowsheet Row ED from 06/29/2023 in [facility]  Emergency Department at [facility] ED from 12/04/2018 in [facility] Emergency Department at [facility]  C-SSRS RISK CATEGORY No Risk Low Risk       AIMS:  , , ,  ,   ASAM:    Substance Abuse:     Past Medical History:  Past Medical History:  Diagnosis Date   ADHD    Bipolar disorder (HCC)    Substance abuse (HCC)     Past Surgical History:  Procedure Laterality Date   TRACHEOSTOMY TUBE PLACEMENT N/A 12/25/2018   Procedure: TRACHEOSTOMY;  Surgeon: Osborn Coho, MD;  Location[facility] OR;  Service: ENT;  Laterality: N/A;   WOUND EXPLORATION Left 12/25/2018   Procedure: control of facial hemorrhaging;  Surgeon: Osborn Coho, MD;  Location: [facility] OR;  Service: ENT;  Laterality: Left;   Family History:  Family History  Problem Relation Age of Onset   Alcohol abuse Father    Bipolar disorder Paternal Aunt    Depression Paternal Aunt    Drug abuse Paternal Aunt    Schizophrenia Paternal Aunt    Bipolar disorder Paternal Uncle    Depression Paternal Uncle     Social History:  Social History   Substance and Sexual Activity  Alcohol Use Yes   Alcohol/week: 0.0 standard drinks of alcohol   Comment: Social     Social History   Substance and Sexual Activity  Drug Use Yes   Types: Marijuana    Social History   Socioeconomic History   Marital status: Single    Spouse name: Not on file   Number of children: Not on file   Years of education: Not on file   Highest education level: Not on file  Occupational History   Not on file  Tobacco Use   Smoking status: Every Day    Current packs/day: 1.00    Average packs/day: 1 pack/day for 19.6 years (19.6 ttl pk-yrs)    Types: Cigarettes    Start date: 11/20/2003   Smokeless tobacco: Never  Substance and Sexual Activity   Alcohol use: Yes    Alcohol/week: 0.0 standard drinks of alcohol    Comment: Social   Drug use: Yes    Types: Marijuana   Sexual activity: Yes    Birth control/protection: Condom   Other Topics Concern   Not on file  Social History Narrative   ** Merged History Encounter **       Social Determinants of Health   Financial Resource Strain: Medium Risk (12/21/2022)   Received from [facility]   Overall Financial Resource Strain (CARDIA)    Difficulty of Paying Living Expenses: Somewhat hard  Food Insecurity: Food Insecurity Present (12/21/2022)   Received from [facility]   Hunger Vital Sign    Worried About Running Out of Food in the Last Year: Sometimes true    Ran Out of Food in the Last Year: Sometimes true  Transportation Needs: Unmet Transportation Needs (12/21/2022)   Received from [facility] - Transportation    Lack of Transportation (Medical): Yes    Lack of Transportation (Non-Medical): Yes  Physical Activity: Not on file  Stress: Stress Concern Present (01/02/2021)   Received  from [facility] - Occupational Stress Questionnaire    Feeling of Stress : Very much  Social Connections: Unknown (04/02/2022)   Received from [facility]   Social Network    Social Network: Not on file      Allergies:  No Known Allergies  Labs:  Results for orders placed or performed during the hospital encounter of 06/29/23 (from the past 48 hour(s))  CBC with Differential     Status: None   Collection Time: 06/29/23  3:45 AM  Result Value Ref Range   WBC 9.6 4.0 - 10.5 K/uL   RBC 4.65 4.22 - 5.81 MIL/uL   Hemoglobin 14.4 13.0 - 17.0 g/dL   HCT 62.1 30.8 - 65.7 %   MCV 91.0 80.0 - 100.0 fL   MCH 31.0 26.0 - 34.0 pg   MCHC 34.0 30.0 - 36.0 g/dL   RDW 84.6 96.2 - 95.2 %   Platelets 264 150 - 400 K/uL   nRBC 0.0 0.0 - 0.2 %   Neutrophils Relative % 79 %   Neutro Abs 7.6 1.7 - 7.7 K/uL   Lymphocytes Relative 11 %   Lymphs Abs 1.0 0.7 - 4.0 K/uL   Monocytes Relative 8 %   Monocytes Absolute 0.7 0.1 - 1.0 K/uL   Eosinophils Relative 1 %   Eosinophils Absolute 0.1 0.0 - 0.5 K/uL   Basophils Relative 1 %    Basophils Absolute 0.1 0.0 - 0.1 K/uL   Immature Granulocytes 0 %   Abs Immature Granulocytes 0.02 0.00 - 0.07 K/uL    Comment: Performed at [facility], [address], [place], [place] [address]  Comprehensive metabolic panel     Status: Abnormal   Collection Time: 06/29/23  3:45 AM  Result Value Ref Range   Sodium 140 135 - 145 mmol/L   Potassium 3.3 (L) 3.5 - 5.1 mmol/L   Chloride 104 98 - 111 mmol/L   CO2 27 22 - 32 mmol/L   Glucose, Bld 99 70 - 99 mg/dL    Comment: Glucose reference range applies only to samples taken after fasting for at least 8 hours.   BUN 14 6 - 20 mg/dL   Creatinine, Ser 4.40 0.61 - 1.24 mg/dL   Calcium 8.7 (L) 8.9 - 10.3 mg/dL   Total Protein 6.7 6.5 - 8.1 g/dL   Albumin 3.9 3.5 - 5.0 g/dL   AST 19 15 - 41 U/L   ALT 18 0 - 44 U/L   Alkaline Phosphatase 61 38 - 126 U/L   Total Bilirubin 0.6 0.3 - 1.2 mg/dL   GFR, Estimated >10 >27 mL/min    Comment: (NOTE) Calculated using the CKD-EPI Creatinine Equation (2021)    Anion gap 9 5 - 15    Comment: Performed at [facility], [address], [place], [place] [address]  Ethanol     Status: None   Collection Time: 06/29/23  3:45 AM  Result Value Ref Range   Alcohol, Ethyl (B) <10 <10 mg/dL    Comment: (NOTE) Lowest detectable limit for serum alcohol is 10 mg/dL.  For medical purposes only. Performed at [facility], [address], [place], [place] [address]   Acetaminophen level     Status: Abnormal   Collection Time: 06/29/23  3:45 AM  Result Value Ref Range   Acetaminophen (Tylenol), Serum <10 (L) 10 - 30 ug/mL    Comment: (NOTE) Therapeutic concentrations vary significantly. A  range of 10-30 ug/mL  may be an effective concentration for many patients. However, some  are best treated at concentrations outside of this range. Acetaminophen concentrations >150 ug/mL at 4 hours after ingestion  and >50 ug/mL at 12 hours after ingestion are  often associated with  toxic reactions.  Performed at [facility], [address] [place], [place] [address]   Salicylate level     Status: Abnormal   Collection Time: 06/29/23  3:45 AM  Result Value Ref Range   Salicylate Lvl <7.0 (L) 7.0 - 30.0 mg/dL    Comment: Performed at [facility], [address] [place], [place] [address]  D-dimer, quantitative     Status: None   Collection Time: 06/29/23  4:00 AM  Result Value Ref Range   D-Dimer, Quant <0.27 0.00 - 0.50 ug/mL-FEU    Comment: (NOTE) At the manufacturer cut-off value of 0.5 g/mL FEU, this assay has a negative predictive value of 95-100%.This assay is intended for use in conjunction with a clinical pretest probability (PTP) assessment model to exclude pulmonary embolism (PE) and deep venous thrombosis (DVT) in outpatients suspected of PE or DVT. Results should be correlated with clinical presentation. Performed at [facility], [address] [place], [place] [address]   Troponin I (High Sensitivity)     Status: None   Collection Time: 06/29/23  6:45 AM  Result Value Ref Range   Troponin I (High Sensitivity) 3 <18 ng/L    Comment: (NOTE) Elevated high sensitivity troponin I (hsTnI) values and significant  changes across serial measurements may suggest ACS but many other  chronic and acute conditions are known to elevate hsTnI results.  Refer to the "Links" section for chest pain algorithms and additional  guidance. Performed at [facility], [address], [place], [place] [address]   Rapid urine drug screen (hospital performed)     Status: Abnormal   Collection Time: 06/29/23 12:50 PM  Result Value Ref Range   Opiates NONE DETECTED NONE DETECTED   Cocaine NONE DETECTED NONE DETECTED   Benzodiazepines POSITIVE (A) NONE DETECTED   Amphetamines POSITIVE (A) NONE DETECTED   Tetrahydrocannabinol POSITIVE (A) NONE DETECTED    Barbiturates NONE DETECTED NONE DETECTED    Comment: (NOTE) DRUG SCREEN FOR MEDICAL PURPOSES ONLY.  IF CONFIRMATION IS NEEDED FOR ANY PURPOSE, NOTIFY LAB WITHIN 5 DAYS.  LOWEST DETECTABLE LIMITS FOR URINE DRUG SCREEN Drug Class                     Cutoff (ng/mL) Amphetamine and metabolites    1000 Barbiturate and metabolites    200 Benzodiazepine                 200 Opiates and metabolites        300 Cocaine and metabolites        300 THC                            50 Performed at [facility], [address] [place], [place] [address]   Urinalysis, Routine w reflex microscopic -Urine, Clean Catch     Status: Abnormal   Collection Time: 06/29/23 12:50 PM  Result Value Ref Range   Color, Urine YELLOW YELLOW   APPearance CLOUDY (A) CLEAR   Specific Gravity, Urine 1.024 1.005 - 1.030   pH 6.0 5.0 - 8.0   Glucose, UA NEGATIVE  NEGATIVE mg/dL   Hgb urine dipstick NEGATIVE NEGATIVE   Bilirubin Urine NEGATIVE NEGATIVE   Ketones, ur NEGATIVE NEGATIVE mg/dL   Protein, ur NEGATIVE NEGATIVE mg/dL   Nitrite NEGATIVE NEGATIVE   Leukocytes,Ua NEGATIVE NEGATIVE    Comment: Performed at [facility], [address], [place], [place] [address]    No current facility-administered medications for this encounter.   Current Outpatient Medications  Medication Sig Dispense Refill   chlorhexidine gluconate, MEDLINE KIT, (PERIDEX) 0.12 % solution 15 mLs by Mouth Rinse route 2 (two) times daily. (Patient not taking: Reported on 06/29/2023) 120 mL 0   Dextrose-Sodium Chloride (DEXTROSE 5 % AND 0.9% NACL) 5-0.9 % infusion Inject 10 mL/hr into the vein continuous. (Patient not taking: Reported on 06/29/2023) 250 mL    fentaNYL (SUBLIMAZE) SOLN Inject 50 mcg into the vein every hour as needed. (Patient not taking: Reported on 06/29/2023)  0   fentaNYL 10 mcg/ml SOLN infusion Inject 25-400 mcg/hr into the vein continuous. (Patient not taking: Reported on 06/29/2023)      HYDROmorphone (DILAUDID) 1 MG/ML injection Inject 1 mL (1 mg total) into the vein every 2 (two) hours as needed for severe pain. (Patient not taking: Reported on 06/29/2023) 1 mL 0   insulin aspart (NOVOLOG) 100 UNIT/ML injection Inject 0-15 Units into the skin every 4 (four) hours. (Patient not taking: Reported on 06/29/2023) 10 mL 11   ipratropium-albuterol (DUONEB) 0.5-2.5 (3) MG/3ML SOLN Take 3 mLs by nebulization every 6 (six) hours. (Patient not taking: Reported on 06/29/2023) 360 mL    lisdexamfetamine (VYVANSE) 30 MG capsule Take 1 capsule (30 mg total) by mouth daily. (Patient not taking: Reported on 12/04/2018) 30 capsule 0   LORazepam (ATIVAN) 2 MG/ML injection Inject 0.5 mLs (1 mg total) into the vein every hour as needed. (Patient not taking: Reported on 06/29/2023) 1 mL 0   metoprolol tartrate (LOPRESSOR) 5 MG/5ML SOLN injection Inject 5 mLs (5 mg total) into the vein every 6 (six) hours as needed (SBP > 180). (Patient not taking: Reported on 06/29/2023) 15 mL    ondansetron (ZOFRAN-ODT) 4 MG disintegrating tablet Take 1 tablet (4 mg total) by mouth every 6 (six) hours as needed for nausea. (Patient not taking: Reported on 06/29/2023) 20 tablet 0   pantoprazole (PROTONIX) 40 MG injection Inject 40 mg into the vein daily. (Patient not taking: Reported on 06/29/2023) 1 each    propofol (DIPRIVAN) 1000 MG/100ML EMUL injection Inject 295-4,720 mcg/min into the vein continuous. (Patient not taking: Reported on 06/29/2023)      Musculoskeletal: Strength & Muscle Tone: within normal limits Gait & Station: normal Patient leans: N/A   Psychiatric Specialty Exam: Presentation  General Appearance:  Appropriate for Environment  Eye Contact: Fair  Speech: Clear and Coherent  Speech Volume: Normal  Handedness: Right   Mood and Affect  Mood: Anxious; Euthymic  Affect: Appropriate   Thought Process  Thought Processes: Coherent  Descriptions of  Associations:Intact  Orientation:Full (Time, Place and Person)  Thought Content:WDL  History of Schizophrenia/Schizoaffective disorder:No data recorded Duration of Psychotic Symptoms:No data recorded Hallucinations:Hallucinations: None  Ideas of Reference:None  Suicidal Thoughts:Suicidal Thoughts: No  Homicidal Thoughts:Homicidal Thoughts: No   Sensorium  Memory: Immediate Fair; Recent Good; Remote Fair  Judgment: Fair  Insight: Fair   Art therapist  Concentration: Good  Attention Span: Good  Recall: Fair  Fund of Knowledge: Fair  Language: Good   Psychomotor Activity  Psychomotor Activity: Psychomotor Activity: Normal   Assets  Assets:  Communication Skills; Desire for Improvement; Social Support    Sleep  Sleep: Sleep: Good   Physical Exam: Physical Exam Vitals and nursing note reviewed. Exam conducted with a chaperone present.  Neurological:     Mental Status: He is alert.  Psychiatric:        Attention and Perception: Attention normal.        Mood and Affect: Mood normal.        Speech: Speech normal.        Behavior: Behavior is cooperative.        Thought Content: Thought content normal.        Cognition and Memory: Memory normal.        Judgment: Judgment is impulsive.    Review of Systems  Constitutional: Negative.   Psychiatric/Behavioral:  Positive for substance abuse.    Blood pressure 104/69, pulse 80, temperature 98 F (36.7 C), resp. rate 16, height 5\' 5"  (1.651 m), weight 67.1 kg, SpO2 96%. Body mass index is 24.63 kg/m.   Medical Decision Making: Patient is psychiatrically cleared. Patient case review and discussed with Dr. Clovis Riley, and patient does not meet inpatient criteria for inpatient psychiatric treatment. At time of discharge, patient denies SI, HI, AVH and can contract for safety. He demonstrated no overt evidence of psychosis or mania. Prior to discharge, he verbalized that they understood warning  signs, triggers, and symptoms of worsening mental health and how to access emergency mental health care if they felt it was needed. Patient was instructed to call 911 or return to the emergency room if they experienced any concerning symptoms after discharge. Patient voiced understanding and agreed to the above.  Patient given resources to follow up with behavioral health urgent care for therapy and medication management. Patient denies access to weapons.    Disposition: Patient does not meet criteria for psychiatric inpatient admission. Supportive therapy provided about ongoing stressors. Discussed crisis plan, support from social network, calling 911, coming to the Emergency Department, and calling Suicide Hotline.   Alona Bene, PMHNP 06/29/2023 2:48 PM
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
## 2023-06-30 DIAGNOSIS — R002 Palpitations: Secondary | ICD-10-CM | POA: Diagnosis not present

## 2023-06-30 LAB — COMPREHENSIVE METABOLIC PANEL
ALT: 21 U/L (ref 0–44)
AST: 33 U/L (ref 15–41)
Albumin: 3.9 g/dL (ref 3.5–5.0)
Alkaline Phosphatase: 70 U/L (ref 38–126)
Anion gap: 14 (ref 5–15)
BUN: 11 mg/dL (ref 6–20)
CO2: 26 mmol/L (ref 22–32)
Calcium: 9.1 mg/dL (ref 8.9–10.3)
Chloride: 99 mmol/L (ref 98–111)
Creatinine, Ser: 1.11 mg/dL (ref 0.61–1.24)
GFR, Estimated: >60 mL/min (ref >60)
Glucose, Bld: 95 mg/dL (ref 70–99)
Potassium: 3.7 mmol/L (ref 3.5–5.1)
Sodium: 139 mmol/L (ref 135–145)
Total Bilirubin: 0.2 mg/dL — ABNORMAL LOW (ref 0.3–1.2)
Total Protein: 7.1 g/dL (ref 6.5–8.1)

## 2023-06-30 LAB — CBC WITH DIFFERENTIAL/PLATELET
Abs Immature Granulocytes: 0.03 10*3/uL (ref 0.00–0.07)
Basophils Absolute: 0.1 10*3/uL (ref 0.0–0.1)
Basophils Relative: 1 %
Eosinophils Absolute: 0.2 10*3/uL (ref 0.0–0.5)
Eosinophils Relative: 2 %
HCT: 46.4 % (ref 39.0–52.0)
Hemoglobin: 15.4 g/dL (ref 13.0–17.0)
Immature Granulocytes: 0 %
Lymphocytes Relative: 21 %
Lymphs Abs: 2.5 10*3/uL (ref 0.7–4.0)
MCH: 30.6 pg (ref 26.0–34.0)
MCHC: 33.2 g/dL (ref 30.0–36.0)
MCV: 92.2 fL (ref 80.0–100.0)
Monocytes Absolute: 0.9 10*3/uL (ref 0.1–1.0)
Monocytes Relative: 8 %
Neutro Abs: 8 10*3/uL — ABNORMAL HIGH (ref 1.7–7.7)
Neutrophils Relative %: 68 %
Platelets: 270 10*3/uL (ref 150–400)
RBC: 5.03 MIL/uL (ref 4.22–5.81)
RDW: 13.2 % (ref 11.5–15.5)
WBC: 11.6 10*3/uL — ABNORMAL HIGH (ref 4.0–10.5)
nRBC: 0 % (ref 0.0–0.2)

## 2023-06-30 LAB — ACETAMINOPHEN LEVEL: Acetaminophen (Tylenol), Serum: <10 ug/mL — ABNORMAL LOW (ref 10–30)

## 2023-06-30 LAB — RAPID URINE DRUG SCREEN, HOSP PERFORMED
Amphetamines: POSITIVE — AB
Barbiturates: NOT DETECTED
Benzodiazepines: NOT DETECTED
Cocaine: NOT DETECTED
Opiates: NOT DETECTED
Tetrahydrocannabinol: POSITIVE — AB

## 2023-06-30 LAB — SALICYLATE LEVEL: Salicylate Lvl: <7 mg/dL — ABNORMAL LOW (ref 7.0–30.0)

## 2023-06-30 LAB — ETHANOL: Alcohol, Ethyl (B): <10 mg/dL (ref <10)

## 2023-06-30 NOTE — ED Notes (Signed)
Labs redrawn and walked to lab by myself.
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
## 2023-06-30 NOTE — Discharge Instructions (Addendum)
Follow up with [facility] as needed. Your work up today is reassuring, urine was positive for amphetamines and cannabis.

## 2023-06-30 NOTE — ED Notes (Signed)
Note from [facility] staff:  Per Cecilio Asper, NP , pt is psych cleared and is to follow up with outpatient services.
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
## 2023-07-03 ENCOUNTER — Encounter (HOSPITAL_COMMUNITY): Payer: Self-pay

## 2023-07-03 ENCOUNTER — Encounter (HOSPITAL_COMMUNITY): Payer: Self-pay | Admitting: Registered Nurse

## 2023-07-03 ENCOUNTER — Other Ambulatory Visit: Payer: Self-pay

## 2023-07-03 ENCOUNTER — Emergency Department (HOSPITAL_COMMUNITY): Payer: Medicare Other

## 2023-07-03 ENCOUNTER — Emergency Department (HOSPITAL_COMMUNITY)
Admission: EM | Admit: 2023-07-03 | Discharge: 2023-07-03 | Disposition: A | Discharge status: Other Acute Inpatient Hospital | Payer: Medicare Other | Attending: Emergency Medicine | Admitting: Emergency Medicine

## 2023-07-03 ENCOUNTER — Ambulatory Visit (INDEPENDENT_AMBULATORY_CARE_PROVIDER_SITE_OTHER)
Admission: EM | Admit: 2023-07-03 | Discharge: 2023-07-04 | Disposition: A | Discharge status: Home / Self Care | Payer: Medicare Other | Source: Home / Self Care

## 2023-07-03 DIAGNOSIS — F17210 Nicotine dependence, cigarettes, uncomplicated: Secondary | ICD-10-CM | POA: Diagnosis not present

## 2023-07-03 DIAGNOSIS — F419 Anxiety disorder, unspecified: Secondary | ICD-10-CM | POA: Insufficient documentation

## 2023-07-03 DIAGNOSIS — F19922 Other psychoactive substance use, unspecified with intoxication with perceptual disturbance: Secondary | ICD-10-CM

## 2023-07-03 DIAGNOSIS — F23 Brief psychotic disorder: Secondary | ICD-10-CM | POA: Diagnosis not present

## 2023-07-03 DIAGNOSIS — F1220 Cannabis dependence, uncomplicated: Secondary | ICD-10-CM | POA: Insufficient documentation

## 2023-07-03 DIAGNOSIS — F19959 Other psychoactive substance use, unspecified with psychoactive substance-induced psychotic disorder, unspecified: Secondary | ICD-10-CM | POA: Insufficient documentation

## 2023-07-03 DIAGNOSIS — F19950 Other psychoactive substance use, unspecified with psychoactive substance-induced psychotic disorder with delusions: Secondary | ICD-10-CM | POA: Diagnosis not present

## 2023-07-03 DIAGNOSIS — F22 Delusional disorders: Secondary | ICD-10-CM

## 2023-07-03 DIAGNOSIS — Z5900 Homelessness unspecified: Secondary | ICD-10-CM | POA: Insufficient documentation

## 2023-07-03 DIAGNOSIS — Z5982 Transportation insecurity: Secondary | ICD-10-CM | POA: Insufficient documentation

## 2023-07-03 DIAGNOSIS — Z5986 Financial insecurity: Secondary | ICD-10-CM | POA: Diagnosis not present

## 2023-07-03 DIAGNOSIS — F19150 Other psychoactive substance abuse with psychoactive substance-induced psychotic disorder with delusions: Secondary | ICD-10-CM | POA: Diagnosis not present

## 2023-07-03 HISTORY — DX: Insomnia, unspecified: G47.00

## 2023-07-03 HISTORY — DX: Other psychoactive substance use, unspecified with psychoactive substance-induced mood disorder: F19.94

## 2023-07-03 HISTORY — DX: Paranoid schizophrenia: F20.0

## 2023-07-03 HISTORY — DX: Generalized anxiety disorder: F41.1

## 2023-07-03 HISTORY — DX: Alcohol abuse, uncomplicated: F10.10

## 2023-07-03 HISTORY — DX: Chronic pain syndrome: G89.4

## 2023-07-03 LAB — CBC WITH DIFFERENTIAL/PLATELET
Abs Immature Granulocytes: 0.02 10*3/uL (ref 0.00–0.07)
Basophils Absolute: 0.1 10*3/uL (ref 0.0–0.1)
Basophils Relative: 1 %
Eosinophils Absolute: 0.2 10*3/uL (ref 0.0–0.5)
Eosinophils Relative: 3 %
HCT: 48.1 % (ref 39.0–52.0)
Hemoglobin: 16.2 g/dL (ref 13.0–17.0)
Immature Granulocytes: 0 %
Lymphocytes Relative: 27 %
Lymphs Abs: 2 10*3/uL (ref 0.7–4.0)
MCH: 31.1 pg (ref 26.0–34.0)
MCHC: 33.7 g/dL (ref 30.0–36.0)
MCV: 92.3 fL (ref 80.0–100.0)
Monocytes Absolute: 0.6 10*3/uL (ref 0.1–1.0)
Monocytes Relative: 8 %
Neutro Abs: 4.5 10*3/uL (ref 1.7–7.7)
Neutrophils Relative %: 61 %
Platelets: 303 10*3/uL (ref 150–400)
RBC: 5.21 MIL/uL (ref 4.22–5.81)
RDW: 13 % (ref 11.5–15.5)
WBC: 7.3 10*3/uL (ref 4.0–10.5)
nRBC: 0 % (ref 0.0–0.2)

## 2023-07-03 LAB — COMPREHENSIVE METABOLIC PANEL
ALT: 22 U/L (ref 0–44)
AST: 24 U/L (ref 15–41)
Albumin: 4.4 g/dL (ref 3.5–5.0)
Alkaline Phosphatase: 69 U/L (ref 38–126)
Anion gap: 16 — ABNORMAL HIGH (ref 5–15)
BUN: 7 mg/dL (ref 6–20)
CO2: 27 mmol/L (ref 22–32)
Calcium: 9.2 mg/dL (ref 8.9–10.3)
Chloride: 101 mmol/L (ref 98–111)
Creatinine, Ser: 1.14 mg/dL (ref 0.61–1.24)
GFR, Estimated: >60 mL/min (ref >60)
Glucose, Bld: 130 mg/dL — ABNORMAL HIGH (ref 70–99)
Potassium: 3.8 mmol/L (ref 3.5–5.1)
Sodium: 144 mmol/L (ref 135–145)
Total Bilirubin: 0.5 mg/dL (ref 0.3–1.2)
Total Protein: 7.7 g/dL (ref 6.5–8.1)

## 2023-07-03 LAB — ETHANOL: Alcohol, Ethyl (B): <10 mg/dL (ref <10)

## 2023-07-03 LAB — RAPID URINE DRUG SCREEN, HOSP PERFORMED
Amphetamines: NOT DETECTED
Barbiturates: NOT DETECTED
Benzodiazepines: NOT DETECTED
Cocaine: NOT DETECTED
Opiates: NOT DETECTED
Tetrahydrocannabinol: POSITIVE — AB

## 2023-07-03 LAB — ACETAMINOPHEN LEVEL: Acetaminophen (Tylenol), Serum: <10 ug/mL — ABNORMAL LOW (ref 10–30)

## 2023-07-03 LAB — SALICYLATE LEVEL: Salicylate Lvl: <7 mg/dL — ABNORMAL LOW (ref 7.0–30.0)

## 2023-07-03 LAB — TROPONIN I (HIGH SENSITIVITY): Troponin I (High Sensitivity): 3 ng/L (ref <18)

## 2023-07-03 MED ORDER — LACTATED RINGERS IV BOLUS
1000.0000 mL | Freq: Once | INTRAVENOUS | Status: AC
  Administered 2023-07-03: 1000 mL via INTRAVENOUS

## 2023-07-03 MED ORDER — OLANZAPINE 5 MG PO TBDP
5.0000 mg | ORAL_TABLET | Freq: Every day | ORAL | Status: DC
  Filled 2023-07-03: qty 1

## 2023-07-03 MED ORDER — MAGNESIUM HYDROXIDE 400 MG/5ML PO SUSP: 30.0000 mL | Freq: Every day | ORAL | Status: DC | PRN

## 2023-07-03 MED ORDER — ACETAMINOPHEN 325 MG PO TABS: 650.0000 mg | ORAL_TABLET | Freq: Four times a day (QID) | ORAL | Status: DC | PRN

## 2023-07-03 MED ORDER — ALUM & MAG HYDROXIDE-SIMETH 200-200-20 MG/5ML PO SUSP: 30.0000 mL | ORAL | Status: DC | PRN

## 2023-07-03 NOTE — ED Notes (Signed)
Pt repeatedly to the doorway of his room fixated on talking to an officer about ingestions. Pt requiring to be redirected multiple times.
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
## 2023-07-03 NOTE — ED Provider Notes (Addendum)
[place] [facility] AT [facility] Provider Note   CSN: [id number] Arrival date & time: 07/03/23  1121     History  Chief Complaint  Patient presents with   Psychiatric Evaluation         Lucas [name] is a 42 y.o. male.  42 year old male presents today for evaluation due to concern of being poisoned.  He states he bought a weed gummy from a smoke shop today and felt like they were attempting to poison him.  He is also concerned that he is being poisoned by the police, hospitals, and other homeless people.  He has had multiple visits regarding similar complaints.  He states that "this needs to be taken seriously".  Denies HI, SI, or AVH.  Denies chest pain to me.  The history is provided by the patient. No language interpreter was used.       Home Medications Prior to Admission medications   Medication Sig Start Date End Date Taking? Authorizing Provider  chlorhexidine gluconate, MEDLINE KIT, (PERIDEX) 0.12 % solution 15 mLs by Mouth Rinse route 2 (two) times daily. Patient not taking: Reported on 06/29/2023 12/25/18   Barnetta Chapel, PA-C  Dextrose-Sodium Chloride (DEXTROSE 5 % AND 0.9% NACL) 5-0.9 % infusion Inject 10 mL/hr into the vein continuous. Patient not taking: Reported on 06/29/2023 12/25/18   Barnetta Chapel, PA-C  fentaNYL (SUBLIMAZE) SOLN Inject 50 mcg into the vein every hour as needed. Patient not taking: Reported on 06/29/2023 12/25/18   Barnetta Chapel, PA-C  fentaNYL 10 mcg/ml SOLN infusion Inject 25-400 mcg/hr into the vein continuous. Patient not taking: Reported on 06/29/2023 12/25/18   Barnetta Chapel, PA-C  HYDROmorphone (DILAUDID) 1 MG/ML injection Inject 1 mL (1 mg total) into the vein every 2 (two) hours as needed for severe pain. Patient not taking: Reported on 06/29/2023 12/25/18   Barnetta Chapel, PA-C  insulin aspart (NOVOLOG) 100 UNIT/ML injection Inject 0-15 Units into the skin every 4 (four) hours. Patient not taking: Reported  on 06/29/2023 12/25/18   Barnetta Chapel, PA-C  ipratropium-albuterol (DUONEB) 0.5-2.5 (3) MG/3ML SOLN Take 3 mLs by nebulization every 6 (six) hours. Patient not taking: Reported on 06/29/2023 12/25/18   Barnetta Chapel, PA-C  lisdexamfetamine (VYVANSE) 30 MG capsule Take 1 capsule (30 mg total) by mouth daily. Patient not taking: Reported on 12/04/2018 04/13/15   Arfeen, Phillips Grout, MD  LORazepam (ATIVAN) 2 MG/ML injection Inject 0.5 mLs (1 mg total) into the vein every hour as needed. Patient not taking: Reported on 06/29/2023 12/25/18   Barnetta Chapel, PA-C  metoprolol tartrate (LOPRESSOR) 5 MG/5ML SOLN injection Inject 5 mLs (5 mg total) into the vein every 6 (six) hours as needed (SBP > 180). Patient not taking: Reported on 06/29/2023 12/25/18   Barnetta Chapel, PA-C  ondansetron (ZOFRAN-ODT) 4 MG disintegrating tablet Take 1 tablet (4 mg total) by mouth every 6 (six) hours as needed for nausea. Patient not taking: Reported on 06/29/2023 12/25/18   Barnetta Chapel, PA-C  pantoprazole (PROTONIX) 40 MG injection Inject 40 mg into the vein daily. Patient not taking: Reported on 06/29/2023 12/26/18   Barnetta Chapel, PA-C  propofol (DIPRIVAN) 1000 MG/100ML EMUL injection Inject 295-4,720 mcg/min into the vein continuous. Patient not taking: Reported on 06/29/2023 12/25/18   Barnetta Chapel, PA-C      Allergies    Patient has no known allergies.    Review of Systems   Review of Systems  Respiratory:  Negative for shortness of breath.   Cardiovascular:  Negative for chest pain.  Psychiatric/Behavioral:  Negative for hallucinations, self-injury, sleep disturbance and suicidal ideas.        Delusions and paranoia are present  All other systems reviewed and are negative.   Physical Exam Updated Vital Signs BP (!) 151/102 (BP Location: Left Arm)   Pulse (!) 115   Temp 98.4 F (36.9 C) (Oral)   Resp 16   SpO2 95%  Physical Exam Vitals and nursing note reviewed.  Constitutional:      General: He is not in acute  distress.    Appearance: Normal appearance. He is not ill-appearing.  HENT:     Head: Normocephalic and atraumatic.     Nose: Nose normal.  Eyes:     Conjunctiva/sclera: Conjunctivae normal.  Cardiovascular:     Rate and Rhythm: Normal rate.  Pulmonary:     Effort: Pulmonary effort is normal. No respiratory distress.  Musculoskeletal:        General: No deformity. Normal range of motion.     Cervical back: Normal range of motion.  Skin:    Findings: No rash.  Neurological:     Mental Status: He is alert.  Psychiatric:        Attention and Perception: Attention normal. He does not perceive auditory hallucinations.        Mood and Affect: Mood is anxious.        Thought Content: Thought content is paranoid and delusional. Thought content does not include homicidal or suicidal ideation.     ED Results / Procedures / Treatments   Labs (all labs ordered are listed, but only abnormal results are displayed) Labs Reviewed  ACETAMINOPHEN LEVEL  CBC WITH DIFFERENTIAL/PLATELET  COMPREHENSIVE METABOLIC PANEL  ETHANOL  RAPID URINE DRUG SCREEN, HOSP PERFORMED  SALICYLATE LEVEL  TROPONIN I (HIGH SENSITIVITY)    EKG None  Radiology No results found.  Procedures Procedures    Medications Ordered in ED Medications  lactated ringers bolus 1,000 mL (1,000 mLs Intravenous New Bag/Given 07/03/23 1220)    ED Course/ Medical Decision Making/ A&P                                 Medical Decision Making Amount and/or Complexity of Data Reviewed Labs: ordered. Radiology: ordered.   42 year old male presents today for evaluation of concern of being poisoned.  Seen in the emergency department for similar complaints frequently.  Denies SI, HI, or AVH.  Does have delusions and paranoia.  Will provide some fluids given his tachycardia.  Will obtain medical clearance labs.  He is agreeable to see psychiatry.  Will place TTS consult.  He does not meet criteria for IVC. Outside of the  week, he denies any illicit drug use or alcohol use.  Patient is not currently taking any medications.  CBC is unremarkable, CMP with glucose 130 otherwise without acute concerns.  Salicylate, acetaminophen, ethanol level within normal.  Initial troponin of 3.  UDS pending.  Chest x-ray without acute cardiopulmonary process with the exception of potential subtle infiltrate.  He is without leukocytosis, afebrile and without cough low suspicion for pneumonia.Marland Kitchen  EKG without acute ischemic change.   UDS pending otherwise he is medically cleared for psych eval. TTS consult placed.  Patient signed out to default provider.  Final Clinical Impression(s) / ED Diagnoses Final diagnoses:  Delusions (HCC)    Rx / DC Orders ED Discharge Orders     None  Marita Kansas, PA-C 07/03/23 1500    Marita Kansas, PA-C 07/03/23 1501    Gloris Manchester, MD 07/03/23 [phone]

## 2023-07-03 NOTE — ED Notes (Signed)
Pt A&O x 4, sleeping at present, no distress noted.  Denies SI. Monitoring for safety.

## 2023-07-03 NOTE — ED Notes (Signed)
Pt is a&ox4, pwd. Pt is very paranoid stating that he is being poisoned and that no one is taking him seriously. He states that he just didn't feel right after taking some pot/gummies from some guy. He rambling about things that do not make sense and that everyone is acting weird toward and is upset at him. Pt attached to monitor/vitals. Side rails up x 2, call light within reach. Pt denies any cp/shob/n/v/d.

## 2023-07-03 NOTE — ED Notes (Signed)
Belongings removed and patient given hospital scrubs to wear. Belongings placed in locker 3 in purple zone.  3 bags total- 2 patient belonging bags and 1 personal book bag.

## 2023-07-03 NOTE — ED Triage Notes (Signed)
Patient bib [facility] after taking delta 8 at 10am and 30 minutes later started having chest pain that is not radiating. He states it is center chest. Patient is homeless and has been out of medications for 4 months. Patient presents to ED stating he is being poisoned and the police and the hospital need to take him serious.   Patient was admitted to a psychiatric facility last week for similar episode.
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
## 2023-07-03 NOTE — ED Notes (Signed)
Pt wanded at this time. Pt very paranoid speaking w/ off-duty [employer]

## 2023-07-03 NOTE — ED Provider Notes (Signed)
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 mcg/ml SOLN infusion Inject 25-400 mcg/hr into the vein continuous. (Patient not taking: Reported on 06/29/2023)   fentaNYL (SUBLIMAZE) SOLN Inject 50 mcg into the vein every hour as needed. 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 Routine labs:  ED labs reviewed  Medication Management: Medications started Meds ordered this encounter  Medications   acetaminophen (TYLENOL) tablet 650 mg   alum & mag hydroxide-simeth (MAALOX/MYLANTA) 200-200-20 MG/5ML suspension 30 mL   magnesium hydroxide (MILK OF MAGNESIA) suspension 30 mL   OLANZapine zydis (ZYPREXA) disintegrating tablet 5 mg    Will maintain continuous observation checks for safety. Social work will consult with patient to discuss discharge and follow up plan.    Recommendations  Based on my evaluation the patient does not appear to have an emergency medical condition.  Makiah Foye, NP 07/03/23  7:31 PM

## 2023-07-03 NOTE — ED Notes (Signed)
Pt sleeping@this time. Breathing even and unlabored. Will continue to monitor for safety 

## 2023-07-03 NOTE — ED Notes (Signed)
Pt just arrived to the unit and he was given dinner

## 2023-07-03 NOTE — ED Notes (Signed)
Pt ambulated to safe transport. All belongings given to safe transport and all paperwork given to safe transport. Pt ambulatory w/ steady gait and VSS.

## 2023-07-04 ENCOUNTER — Encounter (HOSPITAL_COMMUNITY): Payer: Self-pay | Admitting: Emergency Medicine

## 2023-07-04 DIAGNOSIS — F19922 Other psychoactive substance use, unspecified with intoxication with perceptual disturbance: Secondary | ICD-10-CM | POA: Diagnosis not present

## 2023-07-04 DIAGNOSIS — F19950 Other psychoactive substance use, unspecified with psychoactive substance-induced psychotic disorder with delusions: Secondary | ICD-10-CM | POA: Diagnosis not present

## 2023-07-04 DIAGNOSIS — F19150 Other psychoactive substance abuse with psychoactive substance-induced psychotic disorder with delusions: Secondary | ICD-10-CM | POA: Diagnosis not present

## 2023-07-04 MED ORDER — OLANZAPINE 5 MG PO TBDP: 5.0000 mg | ORAL_TABLET | Freq: Every day | ORAL | 0 refills | Status: DC
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
## 2023-07-04 NOTE — ED Notes (Signed)
Pt has been up being disrespectfully to staff,cussing and just being

## 2023-07-04 NOTE — Discharge Instructions (Addendum)
Patient is instructed prior to discharge to:  Take all medications as prescribed by his/her mental healthcare provider. Report any adverse effects and or reactions from the medicines to his/her outpatient provider promptly. Keep all scheduled appointments, to ensure that you are getting refills on time and to avoid any interruption in your medication.  If you are unable to keep an appointment call to reschedule.  Be sure to follow-up with resources and follow-up appointments provided.  Patient has been instructed & cautioned: To not engage in alcohol and or illegal drug use while on prescription medicines. In the event of worsening symptoms, patient is instructed to call the crisis hotline, 911 and or go to the nearest ED for appropriate evaluation and treatment of symptoms. To follow-up with his/her primary care provider for your other medical issues, concerns and or health care needs.  Information: -National Suicide Prevention Lifeline 1-800-SUICIDE or 601-253-6685.  -988 offers 24/7 access to trained crisis counselors who can help people experiencing mental health-related distress. People can call or text 988 or chat 988lifeline.org for themselves or if they are worried about a loved one who may need crisis support.     Outpatient Services for Therapy and Medication Management for [facility] [address], [place], [address] [phone] phone  New Patient Assessment/Therapy Walk-ins Monday and Wednesday: 8am until slots are full. Every 1st and 2nd Friday: 1pm - 5pm  NO ASSESSMENT/THERAPY WALK-INS ON TUESDAYS OR THURSDAYS  New Patient Psychiatry/Medication Management Walk-ins Monday-Friday: 8am-11am  For all walk-ins, we ask that you arrive by 7:30am because patient will be seen in the order of arrival.  Availability is limited; therefore, you may not be seen on the same day that you walk-in.  Our goal is to serve and meet the needs of our  community to the best of our ability.   [facility] [address] [place], [place], [address] [phone] phone  [facility] [address] [place], [place], [address] [address] phone ([address] [employer] [address] - [place], [address], [place], [place], [employer], [address], [employer] [facility] [place], [place], [address], [place], [place], [place], [place], [employer], [employer], [employer], [place])  [facility] [address], [address] [place], [place], [address] [phone] phone  [facility] [address] [place], [place], [address] [phone] phone  [facility] [address], [address] [place], [place], [address] [phone] phone  [facility] [address], [place], [address] [phone] phone  [facility] [place] [address], [place], [address] [phone] phone  [facility] [address] [place], [place], [address] [phone] phone [phone] fax  [facility] [address] [place], [place], [phone] phone [phone] fax  [facility] [phone] [address] [place], [place], [address] [phone] phone  Jovita Kussmaul [address] [place], [place], [address]  [phone] phone  [facility]  (Adults Only) [address] [place], [place], [address]  [phone] phone [phone] fax    Substance Abuse Treatment Resources listed Below:  [facility] - Admissions are currently completed Monday through Friday at 8am; both appointments and walk-ins are accepted.  Any individual that is a [facility] resident may present for a substance abuse screening and assessment for admission.  A person may be referred by numerous sources or self-refer.   Potential clients will  be screened for medical  necessity and appropriateness for the program.  Clients must meet criteria for high-intensity residential treatment services.  If clinically appropriate, a client will continue with the comprehensive clinical assessment and intake process, as well as enrollment in the [facility].  Address: [address] [place], [place] [address] Admin Hours: Bettye Boeck to [facility] Center Hours: 24/7 Phone: [phone] Fax: [phone]  [facility] Address: [address] [place], [place], [place] [address] Behavioral Health Urgent Care [facility]) Hours: 24/7 Phone: [phone] Fax: [phone]  [facility] ([facility]): (offers outpatient therapy and intensive outpatient substance abuse therapy).  [address], [place], [place] [address] Phone: [phone]  [facility]: Offers FREE recovery skills classes, support groups, 1:1 Peer Support, and Compeer Classes. [address], [place], [place] [address] Phone: [phone] (Call to complete intake).   [facility] [address] [place], [place] [address] Phone: [phone] ext [phone] The [facility] provides food, shelter and other programs and services to the homeless men of [place] through our [employer] program.  By offering safe shelter, three meals a day, clean clothing, Biblical counseling, financial planning, vocational training, GED/education and employment assistance, we've helped mend the shattered lives of many homeless men since opening in 1974.  We have approximately 267 beds available, with a max of 312 beds including mats for emergency situations and currently house an average of 270 men a night.  Prospective Client Check-In Information Photo ID Required (State/ Out of State/ [facility]) - if photo ID is not available, clients are required to have a printout of a police/sheriff's criminal history report. Help out with chores around the  [place]. No sex offender of any type (pending, charged, registered and/or any other sex related offenses) will be permitted to check in. Must be willing to abide by all rules, regulations, and policies established by the [employer]. The following will be provided - shelter, food, clothing, and biblical counseling. If you or someone you know is in need of assistance at our [facility] shelter in [place], [place], please call [phone] ext. [phone].  [facility]-will provide timely access to mental health services for children and adolescents (4-17) and adults presenting in a mental health crisis. The program is designed for those who need urgent Behavioral Health or Substance Use treatment and are not experiencing a medical crisis that would typically require an emergency room visit.    [address] [place], [place] [address] Phone: [phone] [email or web address]  [facility]: Phone#: [phone]  The [facility] SA Intensive Outpatient Program (SAIOP) means structured individual and group addiction activities and services that are provided at an outpatient program designed to assist adult and adolescent consumers to begin recovery and learn skills for recovery maintenance. [facility] SAIOP program is offered at least 3 hours a day, 3 days a week.SAIOP services shall include a structured program consisting of, but not limited to, the following services: Individual counseling and support; Group counseling and support; Family counseling, training or support; Biochemical assays to identify recent drug use (e.g., urine drug screens); Strategies for relapse prevention to include community and social support systems in treatment; Life skills; Crisis contingency planning; Disease Management; and Treatment support activities that have been adapted or specifically designed for persons with physical disabilities, or persons with  co-occurring disorders of mental illness and substance abuse/dependence or mental retardation/developmental disability and substance abuse/dependence. Phone: [phone]  Address:   [facility] will also offer the following outpatient services: (Monday through Friday 8am-5pm)   Partial Hospitalization Program (PHP) Substance Abuse Intensive Outpatient Program (SA-IOP) Group Therapy Medication Management Peer Living Room We also provide (24/7):  Assessments: Our mental health clinician and providers will conduct a focused mental health evaluation, assessing for immediate safety concerns and further mental health needs. Referral: Our team will provide resources and help connect to community based mental health treatment, when indicated, including psychotherapy, psychiatry, and other specialized behavioral health or substance use disorder services (for those not already in treatment). Transitional Care: Our team providers in person bridging and/or telephonic follow-up during the patient's transition to outpatient services.  [facility] 24-Hour Call Center: [phone] Behavioral Health Crisis Line: [phone]

## 2023-07-04 NOTE — ED Notes (Signed)
Pt sleeping at present, no distress noted.  Monitoring for safety. 

## 2024-02-02 ENCOUNTER — Emergency Department (HOSPITAL_COMMUNITY)
Admission: EM | Admit: 2024-02-02 | Discharge: 2024-02-02 | Disposition: A | Discharge status: Home / Self Care | Attending: Student | Admitting: Student

## 2024-02-02 DIAGNOSIS — Z5900 Homelessness unspecified: Secondary | ICD-10-CM | POA: Insufficient documentation

## 2024-02-02 DIAGNOSIS — B349 Viral infection, unspecified: Secondary | ICD-10-CM | POA: Insufficient documentation

## 2024-02-02 DIAGNOSIS — F1910 Other psychoactive substance abuse, uncomplicated: Secondary | ICD-10-CM | POA: Insufficient documentation

## 2024-02-02 DIAGNOSIS — R059 Cough, unspecified: Secondary | ICD-10-CM | POA: Diagnosis present

## 2024-02-02 LAB — RESP PANEL BY RT-PCR (RSV, FLU A&B, COVID)  RVPGX2
Influenza A by PCR: NEGATIVE
Influenza B by PCR: NEGATIVE
Resp Syncytial Virus by PCR: NEGATIVE
SARS Coronavirus 2 by RT PCR: NEGATIVE

## 2024-02-02 MED ORDER — ACETAMINOPHEN 325 MG PO TABS
650.0000 mg | ORAL_TABLET | Freq: Once | ORAL | Status: AC
  Administered 2024-02-02: 650 mg via ORAL
  Filled 2024-02-02: qty 2

## 2024-02-02 MED ORDER — LORAZEPAM 0.5 MG PO TABS
0.5000 mg | ORAL_TABLET | Freq: Once | ORAL | Status: AC
  Administered 2024-02-02: 0.5 mg via ORAL
  Filled 2024-02-02: qty 1

## 2024-02-02 NOTE — Discharge Instructions (Addendum)
 Return for any emergent symptoms.  Follow-up with your primary care provider.

## 2024-02-02 NOTE — ED Provider Notes (Signed)
 [place] EMERGENCY DEPARTMENT AT [facility] Provider Note   CSN: [id number] Arrival date & time: 02/02/24  0347     History  Chief Complaint  Patient presents with   [facility]   Mental Health Problem    Lucas [name] is a 43 y.o. male.  43 year old male presents today for concern of not feeling well.  Patient was seen by this provider this morning and was discharged.  According to the triage nurse prior to his initial evaluation earlier this morning he had used kratom.  He is worried about being poisoned.  He has no other complaints.  He keeps stating that "I just do not feel well".  He does have his drink of psychiatric illness.  He denies SI, HI.  The history is provided by the patient. No language interpreter was used.       Home Medications Prior to Admission medications   Medication Sig Start Date End Date Taking? Authorizing Provider  ipratropium-albuterol (DUONEB) 0.5-2.5 (3) MG/3ML SOLN Take 3 mLs by nebulization every 6 (six) hours. Patient not taking: Reported on 06/29/2023 12/25/18   Barnetta Chapel, PA-C  OLANZapine zydis (ZYPREXA) 5 MG disintegrating tablet Take 1 tablet (5 mg total) by mouth at bedtime. 07/04/23   Lenard Lance, FNP      Allergies    Patient has no known allergies.    Review of Systems   Review of Systems  Constitutional:  Negative for chills and fever.  Psychiatric/Behavioral:  Negative for suicidal ideas. The patient is hyperactive.   All other systems reviewed and are negative.   Physical Exam Updated Vital Signs BP (!) 128/104 (BP Location: Left Arm)   Pulse (!) 106   Temp 98.1 F (36.7 C) (Oral)   Resp 16   SpO2 98%  Physical Exam Vitals and nursing note reviewed.  Constitutional:      General: He is not in acute distress.    Appearance: Normal appearance. He is not ill-appearing.  HENT:     Head: Normocephalic and atraumatic.     Nose: Nose normal.  Eyes:     Conjunctiva/sclera: Conjunctivae normal.   Cardiovascular:     Rate and Rhythm: Tachycardia present.  Pulmonary:     Effort: Pulmonary effort is normal. No respiratory distress.  Musculoskeletal:        General: No deformity. Normal range of motion.     Cervical back: Normal range of motion.  Skin:    Findings: No rash.  Neurological:     General: No focal deficit present.     Mental Status: He is alert.  Psychiatric:        Thought Content: Thought content does not include homicidal or suicidal ideation. Thought content does not include homicidal or suicidal plan.     ED Results / Procedures / Treatments   Labs (all labs ordered are listed, but only abnormal results are displayed) Labs Reviewed - No data to display  EKG None  Radiology No results found.  Procedures Procedures    Medications Ordered in ED Medications  LORazepam (ATIVAN) tablet 0.5 mg (0.5 mg Oral Given 02/02/24 1057)    ED Course/ Medical Decision Making/ A&P                                 Medical Decision Making Risk Prescription drug management.   43 year old male presents today for concern of above-mentioned complaints.  He is hemodynamically  stable.  Given the Kratom will give him 0.5 mg of Ativan to counteract its effects..  Patient on my evaluation was standing at the doorway.  He is satisfied with the dose of Ativan.  Apparently after this dose he felt significantly better and he stated he wanted to be discharged.  He has no other new complaints since previously being seen.   Final Clinical Impression(s) / ED Diagnoses Final diagnoses:  Substance abuse [facility])    Rx / DC Orders ED Discharge Orders     None         Marita Kansas, PA-C 02/02/24 1147    Glendora Score, MD 02/02/24 2108

## 2024-02-02 NOTE — ED Triage Notes (Signed)
 Pt just discharged from the ED, but now checking back in stating "the government is poisoning me". Pt admits to using Kratom at 0600 just prior to first evaluation.

## 2024-02-02 NOTE — Discharge Instructions (Signed)
 Your respiratory panel is negative.  Take Tylenol and ibuprofen as you need to.  Do not exceed more than 4000 mg of Tylenol in 24 hours, and take the ibuprofen 600 mg every 8 hours.  Return for any emergent symptoms.  Follow-up with your primary care doctor.  Drink plenty of fluids.

## 2024-02-02 NOTE — ED Provider Notes (Signed)
 [place] EMERGENCY DEPARTMENT AT [facility] Provider Note   CSN: [id number] Arrival date & time: 02/02/24  4098     History  Chief Complaint  Patient presents with   Illness    Lucas [name] is a 43 y.o. male.  43 year old male presents today for concern of not feeling well since this morning.  He states he feels like he has a bad fluid.  Endorses overall not feeling well.  Denies suicidal ideation.  He states I feel like I am being poisoned.  He denies any ingestions that are out of the norm.  He denies any chest pain, shortness of breath.  Does endorse cough, congestion.  The history is provided by the patient. No language interpreter was used.       Home Medications Prior to Admission medications   Medication Sig Start Date End Date Taking? Authorizing Provider  ipratropium-albuterol (DUONEB) 0.5-2.5 (3) MG/3ML SOLN Take 3 mLs by nebulization every 6 (six) hours. Patient not taking: Reported on 06/29/2023 12/25/18   Barnetta Chapel, PA-C  OLANZapine zydis (ZYPREXA) 5 MG disintegrating tablet Take 1 tablet (5 mg total) by mouth at bedtime. 07/04/23   Lenard Lance, FNP      Allergies    Patient has no known allergies.    Review of Systems   Review of Systems  Constitutional:  Negative for chills and fever.  HENT:  Positive for congestion.   Respiratory:  Positive for cough. Negative for shortness of breath.   Cardiovascular:  Negative for chest pain and palpitations.  Gastrointestinal:  Negative for abdominal pain, nausea and vomiting.  Neurological:  Negative for light-headedness.  All other systems reviewed and are negative.   Physical Exam Updated Vital Signs BP (!) 165/94   Pulse (!) 112   Temp 99 F (37.2 C) (Oral)   Resp 17   SpO2 96%  Physical Exam Vitals and nursing note reviewed.  Constitutional:      General: He is not in acute distress.    Appearance: Normal appearance. He is not ill-appearing.  HENT:     Head:  Normocephalic and atraumatic.     Nose: Nose normal.  Eyes:     Conjunctiva/sclera: Conjunctivae normal.  Cardiovascular:     Rate and Rhythm: Normal rate and regular rhythm.  Pulmonary:     Effort: Pulmonary effort is normal. No respiratory distress.  Musculoskeletal:        General: No deformity. Normal range of motion.     Cervical back: Normal range of motion.  Skin:    Findings: No rash.  Neurological:     Mental Status: He is alert.     ED Results / Procedures / Treatments   Labs (all labs ordered are listed, but only abnormal results are displayed) Labs Reviewed  RESP PANEL BY RT-PCR (RSV, FLU A&B, COVID)  RVPGX2    EKG None  Radiology No results found.  Procedures Procedures    Medications Ordered in ED Medications  acetaminophen (TYLENOL) tablet 650 mg (has no administration in time range)    ED Course/ Medical Decision Making/ A&P                                 Medical Decision Making Risk OTC drugs.   43 year old male presents today for concern of just overall not feeling well that started this morning.  He appears to be anxious during exam.  He is  concerned about being poisoned but denies any ingestions.  He appears well.  Hemodynamically he is stable with the exception of tachycardia that was noted but this could be due to his anxiety/nervousness. Strict return precautions discussed.  Discussed follow-up with his PCP.  Patient is in agreement.   Final Clinical Impression(s) / ED Diagnoses Final diagnoses:  Viral syndrome    Rx / DC Orders ED Discharge Orders     None         Marita Kansas, PA-C 02/02/24 0981    Glendora Score, MD 02/02/24 2107

## 2024-02-02 NOTE — ED Triage Notes (Addendum)
 Pt c/o feeling ill, " I can;t explain it, I feel ill, like I'm coming down with a bad flu or something". " I feel like I'm being poisoned." Hx of schizophrenia , denying SI/HI currently

## 2024-02-03 ENCOUNTER — Encounter (HOSPITAL_COMMUNITY): Payer: Self-pay | Admitting: *Deleted

## 2024-02-03 ENCOUNTER — Other Ambulatory Visit: Payer: Self-pay

## 2024-02-03 ENCOUNTER — Emergency Department (HOSPITAL_COMMUNITY)
Admission: EM | Admit: 2024-02-03 | Discharge: 2024-02-03 | Disposition: A | Discharge status: Home / Self Care | Attending: Emergency Medicine | Admitting: Emergency Medicine

## 2024-02-03 DIAGNOSIS — F22 Delusional disorders: Secondary | ICD-10-CM | POA: Diagnosis present

## 2024-02-03 NOTE — Discharge Instructions (Signed)
 Please follow-up with the behavioral health urgent care listed below and return to the ER with any new severe symptoms.

## 2024-02-03 NOTE — ED Provider Notes (Signed)
 [place] EMERGENCY DEPARTMENT AT [facility] Provider Note   CSN: [id number] Arrival date & time: 02/03/24  7829     History  No chief complaint on file.   Kalab Camps is a 43 y.o. male who presents with concern for belief that he is being poisoned.  Patient unable to provide any further history.  Does state that he used kratom prior to his arrival to the emergency department today.  History of same.  History of substance-induced psychotic disorder.  Supposed to be on antennae psychotic medications but is not currently.  Seen for same presentation last 24 hours multiple times.  HPI     Home Medications Prior to Admission medications   Medication Sig Start Date End Date Taking? Authorizing Provider  ipratropium-albuterol (DUONEB) 0.5-2.5 (3) MG/3ML SOLN Take 3 mLs by nebulization every 6 (six) hours. Patient not taking: Reported on 06/29/2023 12/25/18   Barnetta Chapel, PA-C  OLANZapine zydis (ZYPREXA) 5 MG disintegrating tablet Take 1 tablet (5 mg total) by mouth at bedtime. 07/04/23   Lenard Lance, FNP      Allergies    Patient has no known allergies.    Review of Systems   Review of Systems  Psychiatric/Behavioral:         Paranoia    Physical Exam Updated Vital Signs BP (!) 144/103 (BP Location: Right Arm)   Pulse 97   Temp 97.7 F (36.5 C)   Resp 20   Ht 5\' 5"  (1.651 m)   Wt 67.1 kg   SpO2 97%   BMI 24.62 kg/m  Physical Exam Vitals and nursing note reviewed.  HENT:     Head: Normocephalic and atraumatic.  Eyes:     General: No scleral icterus.       Right eye: No discharge.        Left eye: No discharge.     Conjunctiva/sclera: Conjunctivae normal.  Pulmonary:     Effort: Pulmonary effort is normal.  Skin:    General: Skin is warm and dry.     Capillary Refill: Capillary refill takes less than 2 seconds.  Neurological:     General: No focal deficit present.     Mental Status: He is alert and oriented to person, place, and time.   Psychiatric:        Mood and Affect: Mood normal.        Behavior: Behavior is cooperative.        Thought Content: Thought content is paranoid and delusional.     Comments: Patient perseverating on the idea that he is being poisoned but unable to elaborate any further.     ED Results / Procedures / Treatments   Labs (all labs ordered are listed, but only abnormal results are displayed) Labs Reviewed - No data to display  EKG None  Radiology No results found.  Procedures Procedures    Medications Ordered in ED Medications - No data to display  ED Course/ Medical Decision Making/ A&P                                 Medical Decision Making 43 year old male presents with belief he is being  poisoned.    Hypertensive on intake, vitals otherwise normal.  Cardiopulmonary exam unremarkable, psychiatric interview concerning for delusional behavior, patient calm elaborative, pleasant, easily directable but does appear to be responding to internal stimuli.   There is no evidence of  patient being a danger to himself or to others at this time, no evidence of SI or HI on interview.  Clinical concern for emergent psychiatric condition that would work ED evaluation is low.  Recommend close outpatient follow-up with behavioral health urgent care.  [place]  voiced understanding of his medical evaluation and treatment plan. Each of their questions answered to their expressed satisfaction.  Return precautions were given.  Patient is well-appearing, stable, and was discharged in good condition.  This chart was dictated using voice recognition software, Dragon. Despite the best efforts of this provider to proofread and correct errors, errors may still occur which can change documentation meaning.         Final Clinical Impression(s) / ED Diagnoses Final diagnoses:  Delusion [facility])    Rx / DC Orders ED Discharge Orders     None         Sherrilee Gilles 02/03/24 0650    Gloris Manchester, MD 02/03/24 [phone]

## 2024-02-03 NOTE — ED Triage Notes (Signed)
 The pt feels like he is being poisoned   he reports that he has muscle aches

## 2024-02-08 ENCOUNTER — Other Ambulatory Visit: Payer: Self-pay

## 2024-02-08 ENCOUNTER — Emergency Department (HOSPITAL_BASED_OUTPATIENT_CLINIC_OR_DEPARTMENT_OTHER)
Admission: EM | Admit: 2024-02-08 | Discharge: 2024-02-09 | Disposition: A | Discharge status: Home / Self Care | Attending: Emergency Medicine | Admitting: Emergency Medicine

## 2024-02-08 ENCOUNTER — Emergency Department (HOSPITAL_BASED_OUTPATIENT_CLINIC_OR_DEPARTMENT_OTHER)

## 2024-02-08 ENCOUNTER — Encounter (HOSPITAL_BASED_OUTPATIENT_CLINIC_OR_DEPARTMENT_OTHER): Payer: Self-pay | Admitting: Emergency Medicine

## 2024-02-08 DIAGNOSIS — S92014A Nondisplaced fracture of body of right calcaneus, initial encounter for closed fracture: Secondary | ICD-10-CM | POA: Diagnosis not present

## 2024-02-08 DIAGNOSIS — W1789XA Other fall from one level to another, initial encounter: Secondary | ICD-10-CM | POA: Insufficient documentation

## 2024-02-08 DIAGNOSIS — F17210 Nicotine dependence, cigarettes, uncomplicated: Secondary | ICD-10-CM | POA: Diagnosis not present

## 2024-02-08 DIAGNOSIS — S99921A Unspecified injury of right foot, initial encounter: Secondary | ICD-10-CM | POA: Diagnosis present

## 2024-02-08 NOTE — ED Triage Notes (Signed)
 Patient presents with right ankle pain s/p fall. States "trying to climb over fence and fell, landing on right foot". Denies head injury or loc. + pulses

## 2024-02-09 DIAGNOSIS — S92014A Nondisplaced fracture of body of right calcaneus, initial encounter for closed fracture: Secondary | ICD-10-CM | POA: Diagnosis not present

## 2024-02-09 MED ORDER — ACETAMINOPHEN 325 MG PO TABS: 650.0000 mg | ORAL_TABLET | Freq: Four times a day (QID) | ORAL | 0 refills | Status: DC | PRN

## 2024-02-09 MED ORDER — IBUPROFEN 600 MG PO TABS: 600.0000 mg | ORAL_TABLET | Freq: Four times a day (QID) | ORAL | 0 refills | Status: DC | PRN

## 2024-02-09 MED ORDER — KETOROLAC TROMETHAMINE 30 MG/ML IJ SOLN
30.0000 mg | Freq: Once | INTRAMUSCULAR | Status: AC
  Administered 2024-02-09: 30 mg via INTRAMUSCULAR
  Filled 2024-02-09: qty 1

## 2024-02-09 MED ORDER — OXYCODONE HCL 5 MG PO TABS: 5.0000 mg | ORAL_TABLET | Freq: Four times a day (QID) | ORAL | 0 refills | Status: DC | PRN

## 2024-02-09 MED ORDER — HYDROCODONE-ACETAMINOPHEN 5-325 MG PO TABS
1.0000 | ORAL_TABLET | Freq: Once | ORAL | Status: AC
  Administered 2024-02-09: 1 via ORAL
  Filled 2024-02-09: qty 1

## 2024-02-09 NOTE — Discharge Instructions (Addendum)
 Recommend you keep your ankle elevated to reduce pain and swelling to your heel. Keep heel at level above your heart to reduce swelling, do this for the next 5 days.  Keep the splint clean and dry, avoid getting it wet.  Recommend you use crutches and not put any weight onto your right ankle.  Please call orthopedic office on Monday at phone number provided to arrange follow-up  It was a pleasure caring for you today in the emergency department.  Please return to the emergency department for any worsening or worrisome symptoms, including but not limited to: Worsening pain, numbness or loss of sensation to your foot, significant swelling to your foot, etc.
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
## 2024-02-10 ENCOUNTER — Ambulatory Visit: Payer: Self-pay

## 2024-02-10 ENCOUNTER — Other Ambulatory Visit: Payer: Self-pay

## 2024-02-10 ENCOUNTER — Emergency Department (HOSPITAL_COMMUNITY)
Admission: EM | Admit: 2024-02-10 | Discharge: 2024-02-10 | Disposition: A | Discharge status: Home / Self Care | Attending: Emergency Medicine | Admitting: Emergency Medicine

## 2024-02-10 DIAGNOSIS — M79671 Pain in right foot: Secondary | ICD-10-CM | POA: Diagnosis present

## 2024-02-10 DIAGNOSIS — S92014D Nondisplaced fracture of body of right calcaneus, subsequent encounter for fracture with routine healing: Secondary | ICD-10-CM | POA: Diagnosis not present

## 2024-02-10 DIAGNOSIS — X58XXXD Exposure to other specified factors, subsequent encounter: Secondary | ICD-10-CM | POA: Insufficient documentation

## 2024-02-10 NOTE — Discharge Instructions (Addendum)
 Please follow up with orthopedic surgery within 1-2 weeks. You need to call Dr. Kathi Der office to make a follow up appointment.   You can alternate taking Tylenol and ibuprofen as needed for pain. You can take 650mg  tylenol (acetaminophen) every 4-6 hours, and 600 mg ibuprofen 3 times a day. You can supplement with oxycodone 5 mg every 4-6 hours prescribed by your physician yesterday.   Please utilize the crutches provided and do not bear weight on your right leg/foot. Likely this injury will not require surgery and will heal on its own over the next 6-8 weeks, but you need to follow up with an orthopedic surgeon to be sure. Please ask the orthopedic surgeon about the wheelchair.  Please return for numbness/tingling, cold/blue toes, severe pain, new injury.

## 2024-02-10 NOTE — ED Triage Notes (Signed)
 Pt states he broke his right foot on 03/22 and "needs it fixed". Pt arrives with crutches and bandage on right foot, but states he's afraid something is wrong.

## 2024-02-10 NOTE — ED Provider Notes (Signed)
 [place] EMERGENCY DEPARTMENT AT [facility] Provider Note   CSN: [id number] Arrival date & time: 02/10/24  1527     History  Chief Complaint  Patient presents with   Foot Pain    Lucas [name] is a 43 y.o. male with PMH as listed below who presents for subsequent encounter for calcaneal fracture. .Pt states he broke his right foot on 03/22 and "needs it fixed". Pt arrives with crutches and bandage on right foot, but states he's afraid something is wrong. He requests that I "do whatever is needed to fix it." He states he "has never heard of a bone healing on its own." He denies recurrent injury or numbness tingling. He also states he had a nightmare where the foot fell off and he is very nervous.   Xrays obtained in ER yesterday demonstrate acute right nondisplaced calcaneal fracture.  He was placed in a short leg splint, given crutches, and instructed to fu with orthopedic surgery. He was also prescribed pain medication.   Past Medical History:  Diagnosis Date   ADHD    Alcohol abuse    Bipolar disorder (HCC)    Chronic pain syndrome    GAD (generalized anxiety disorder)    Insomnia    Paranoid schizophrenia (HCC)    Substance abuse (HCC)    Substance induced mood disorder (HCC)        Home Medications Prior to Admission medications   Medication Sig Start Date End Date Taking? Authorizing Provider  acetaminophen (TYLENOL) 325 MG tablet Take 2 tablets (650 mg total) by mouth every 6 (six) hours as needed. 02/09/24   Sloan Leiter, DO  ibuprofen (ADVIL) 600 MG tablet Take 1 tablet (600 mg total) by mouth every 6 (six) hours as needed. 02/09/24   Sloan Leiter, DO  ipratropium-albuterol (DUONEB) 0.5-2.5 (3) MG/3ML SOLN Take 3 mLs by nebulization every 6 (six) hours. Patient not taking: Reported on 06/29/2023 12/25/18   Barnetta Chapel, PA-C  OLANZapine zydis (ZYPREXA) 5 MG disintegrating tablet Take 1 tablet (5 mg total) by mouth at bedtime. 07/04/23    Lenard Lance, FNP  oxyCODONE (ROXICODONE) 5 MG immediate release tablet Take 1 tablet (5 mg total) by mouth every 6 (six) hours as needed for severe pain (pain score 7-10). 02/09/24   Sloan Leiter, DO      Allergies    Patient has no known allergies.    Review of Systems   Review of Systems A 10 point review of systems was performed and is negative unless otherwise reported in HPI.  Physical Exam Updated Vital Signs BP (!) 156/107 (BP Location: Left Arm)   Pulse (!) 101   Temp 97.8 F (36.6 C) (Oral)   Resp 18   SpO2 97%  Physical Exam General: Normal appearing male, lying in bed.  HEENT: Sclera anicteric, MMM, trachea midline.  Cardiology: RRR, no murmurs/rubs/gallops.  Resp: Normal respiratory rate and effort. CTAB, no wheezes, rhonchi, crackles.  Abd: Soft, non-tender, non-distended. No rebound tenderness or guarding.  GU: Deferred. MSK: R lower leg in short leg splint with warm toes with good cap refill, intact movement of toes, no signs compartment syndrome. Skin: warm, dry.  Neuro: A&Ox4, CNs II-XII grossly intact. MAEs. Sensation grossly intact.  Psych: Normal mood and affect.   ED Results / Procedures / Treatments   Labs (all labs ordered are listed, but only abnormal results are displayed) Labs Reviewed - No data to display  EKG None  Radiology  DG Ankle Complete Right Result Date: 02/08/2024 CLINICAL DATA:  fall right ankle pain s/p fall. States "trying to climb over fence and fell, landing on right foot". EXAM: RIGHT ANKLE - COMPLETE 3+ VIEW COMPARISON:  None Available. FINDINGS: Acute nondisplaced calcaneal fracture. Plantar calcaneal spur. No dislocation. There is no evidence of arthropathy or other focal bone abnormality. Soft tissues are unremarkable. IMPRESSION: Acute nondisplaced calcaneal fracture. Electronically Signed   By: Tish Frederickson M.D.   On: 02/08/2024 23:45    Procedures Procedures    Medications Ordered in ED Medications - No data to  display  ED Course/ Medical Decision Making/ A&P                          Medical Decision Making   This patient presents to the ED for concern of right foot pain/calcaneal fracture, this involves an extensive number of treatment options, and is a complaint that carries with it a high risk of complications and morbidity. HDS, overall well-appearing.  MDM:    Patient with known right nondisplaced calcaneal fracture, splinted yesterday. He's had no new neurovascular symptoms, reassuring exam, no new trauma. He has crutches at bedside. He requests that we "do whatever we do to fix the bones." I discussed with the patient that his nondisplaced calcaneal fracture will likely heal with time with NWB and boot/splint but that he needs to f/u with orthopedic surgery. He is relatively incredulous but is reassured. Already was prescribed oxy and I discussed with patient about tylenol/ibuprofen supplemented with oxycodone. He requests wheelchair for assistance, and I relayed that he can use the crutches, but he requests wheelchair. I do not have wheelchair to give him here today but suggested he can d/w orthopedic surgery.      Additional history obtained from chart review.   Social Determinants of Health: Lives independently  Disposition:  DC w/ discharge instructions/return precautions. All questions answered to patient's satisfaction.    Co morbidities that complicate the patient evaluation  Past Medical History:  Diagnosis Date   ADHD    Alcohol abuse    Bipolar disorder (HCC)    Chronic pain syndrome    GAD (generalized anxiety disorder)    Insomnia    Paranoid schizophrenia (HCC)    Substance abuse (HCC)    Substance induced mood disorder (HCC)      Medicines No orders of the defined types were placed in this encounter.   I have reviewed the patients home medicines and have made adjustments as needed  Problem List / ED Course: Problem List Items Addressed This Visit    None Visit Diagnoses       Closed nondisplaced fracture of body of right calcaneus with routine healing, subsequent encounter    -  Primary                   This note was created using dictation software, which may contain spelling or grammatical errors.    Loetta Rough, MD 02/10/24 2110

## 2024-02-10 NOTE — Telephone Encounter (Signed)
  Chief Complaint: foot pain Symptoms: right ankle pain and swelling Frequency: since Saturday Pertinent Negatives: Patient denies fever, cut Disposition: [x] ED /[] Urgent Care (no appt availability in office) / [] Appointment(In office/virtual)/ []  [facility]/ [] Home Care/ [] Refused Recommended Disposition /[] [place] [facility]/ []  Follow-up with PCP Additional Notes: Patient reports he was seen in the ED on Saturday for a right ankle injury. Patient reports the ED was "no help and did not fix my foot". Patient reports he feels he needs emergency surgery today because the pain and swelling are worsening. Patient advised no available appts at requested [facility] office for new patients, offered other offices but patient does not want to wait to be seen. States he needs surgery today and will not wait. Attempted to explain the process of PCP referrals to patient, who became increasingly agitated and stated that the ED "dropped the ball and should've done emergency surgery when he was seen there". Advised patient that if he is experiencing worsening symptoms and does not want to wait to be seen by PCP, this RN recommends going back to the ED for further evaluation. Patient agreeable and states he will "give them a call".     Copied from CRM [id number]. Topic: Clinical - Red Word Triage >> Feb 10, 2024  8:49 AM Fuller Mandril wrote: Red Word that prompted transfer to Nurse Triage: Severe pain, foot broke. Can't take it. Reason for Disposition  Can't stand (bear weight) or walk  Answer Assessment - Initial Assessment Questions 1. MECHANISM: "How did the injury happen?" (e.g., twisting injury, direct blow)      "I tripped" 2. ONSET: "When did the injury happen?" (Minutes or hours ago)      Saturday 3. LOCATION: "Where is the injury located?"      Right ankle 4. APPEARANCE of INJURY: "What does the injury look like?"      swollen 5. WEIGHT-BEARING: "Can you put weight on that foot?" "Can you walk  (four steps or more)?"       no 6. SIZE: For cuts, bruises, or swelling, ask: "How large is it?" (e.g., inches or centimeters;  entire joint)      Swelling 7. PAIN: "Is there pain?" If Yes, ask: "How bad is the pain?"    (e.g., Scale 1-10; or mild, moderate, severe)   - NONE (0): no pain.   - MILD (1-3): doesn't interfere with normal activities.    - MODERATE (4-7): interferes with normal activities (e.g., work or school) or awakens from sleep, limping.    - SEVERE (8-10): excruciating pain, unable to do any normal activities, unable to walk.      severe 8. TETANUS: For any breaks in the skin, ask: "When was the last tetanus booster?"     N/a 9. OTHER SYMPTOMS: "Do you have any other symptoms?"      Swelling of ankle  Protocols used: Ankle and Foot Injury-A-AH

## 2024-04-29 ENCOUNTER — Other Ambulatory Visit: Payer: Self-pay

## 2024-04-29 ENCOUNTER — Emergency Department (HOSPITAL_COMMUNITY)
Admission: EM | Admit: 2024-04-29 | Discharge: 2024-04-29 | Disposition: A | Discharge status: Home / Self Care | Attending: Emergency Medicine | Admitting: Emergency Medicine

## 2024-04-29 ENCOUNTER — Emergency Department (HOSPITAL_COMMUNITY)

## 2024-04-29 DIAGNOSIS — M79671 Pain in right foot: Secondary | ICD-10-CM | POA: Insufficient documentation

## 2024-04-29 DIAGNOSIS — F17210 Nicotine dependence, cigarettes, uncomplicated: Secondary | ICD-10-CM | POA: Insufficient documentation

## 2024-04-29 MED ORDER — IBUPROFEN 200 MG PO TABS
400.0000 mg | ORAL_TABLET | Freq: Once | ORAL | Status: AC
  Administered 2024-04-29: 400 mg via ORAL
  Filled 2024-04-29: qty 2

## 2024-04-29 MED ORDER — ACETAMINOPHEN 500 MG PO TABS
1000.0000 mg | ORAL_TABLET | Freq: Once | ORAL | Status: AC
  Administered 2024-04-29: 1000 mg via ORAL
  Filled 2024-04-29: qty 2

## 2024-04-29 NOTE — Discharge Instructions (Signed)
 Your foot x-ray was normal.  Your fracture has healed.  You do not have any signs of infection in your foot today.  You can take Motrin  and Tylenol .  I have included the telephone number for an orthopedic doctor.  Please give them a call this week to set up a follow-up appointment.
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
## 2024-04-29 NOTE — ED Triage Notes (Signed)
 Pt states that his right foot is rotting off because he broke it 2 months ago stepping off of a cliff. Pt treats the pain by drinking and came to the ED drinking alcohol. Pt lives with his uncle and dropped off.

## 2024-05-11 ENCOUNTER — Encounter (HOSPITAL_COMMUNITY): Payer: Self-pay | Admitting: Emergency Medicine

## 2024-05-11 ENCOUNTER — Emergency Department (HOSPITAL_COMMUNITY)
Admission: EM | Admit: 2024-05-11 | Discharge: 2024-05-11 | Disposition: A | Discharge status: Home / Self Care | Attending: Emergency Medicine | Admitting: Emergency Medicine

## 2024-05-11 ENCOUNTER — Other Ambulatory Visit: Payer: Self-pay

## 2024-05-11 DIAGNOSIS — F22 Delusional disorders: Secondary | ICD-10-CM | POA: Insufficient documentation

## 2024-05-11 DIAGNOSIS — I10 Essential (primary) hypertension: Secondary | ICD-10-CM | POA: Diagnosis not present

## 2024-05-11 LAB — COMPREHENSIVE METABOLIC PANEL WITH GFR
ALT: 32 U/L (ref 0–44)
AST: 35 U/L (ref 15–41)
Albumin: 3.8 g/dL (ref 3.5–5.0)
Alkaline Phosphatase: 78 U/L (ref 38–126)
Anion gap: 10 (ref 5–15)
BUN: 13 mg/dL (ref 6–20)
CO2: 24 mmol/L (ref 22–32)
Calcium: 9 mg/dL (ref 8.9–10.3)
Chloride: 103 mmol/L (ref 98–111)
Creatinine, Ser: 0.76 mg/dL (ref 0.61–1.24)
GFR, Estimated: >60 mL/min (ref >60)
Glucose, Bld: 101 mg/dL — ABNORMAL HIGH (ref 70–99)
Potassium: 3.6 mmol/L (ref 3.5–5.1)
Sodium: 137 mmol/L (ref 135–145)
Total Bilirubin: 0.7 mg/dL (ref 0.0–1.2)
Total Protein: 7.1 g/dL (ref 6.5–8.1)

## 2024-05-11 LAB — CBC WITH DIFFERENTIAL/PLATELET
Abs Immature Granulocytes: 0.03 10*3/uL (ref 0.00–0.07)
Basophils Absolute: 0.1 10*3/uL (ref 0.0–0.1)
Basophils Relative: 1 %
Eosinophils Absolute: 0.1 10*3/uL (ref 0.0–0.5)
Eosinophils Relative: 1 %
HCT: 44.2 % (ref 39.0–52.0)
Hemoglobin: 15.1 g/dL (ref 13.0–17.0)
Immature Granulocytes: 0 %
Lymphocytes Relative: 31 %
Lymphs Abs: 2.5 10*3/uL (ref 0.7–4.0)
MCH: 30.5 pg (ref 26.0–34.0)
MCHC: 34.2 g/dL (ref 30.0–36.0)
MCV: 89.3 fL (ref 80.0–100.0)
Monocytes Absolute: 0.8 10*3/uL (ref 0.1–1.0)
Monocytes Relative: 10 %
Neutro Abs: 4.7 10*3/uL (ref 1.7–7.7)
Neutrophils Relative %: 57 %
Platelets: 279 10*3/uL (ref 150–400)
RBC: 4.95 MIL/uL (ref 4.22–5.81)
RDW: 13.9 % (ref 11.5–15.5)
WBC: 8.2 10*3/uL (ref 4.0–10.5)
nRBC: 0 % (ref 0.0–0.2)

## 2024-05-11 LAB — SALICYLATE LEVEL: Salicylate Lvl: <7 mg/dL — ABNORMAL LOW (ref 7.0–30.0)

## 2024-05-11 LAB — ETHANOL: Alcohol, Ethyl (B): 111 mg/dL — ABNORMAL HIGH (ref <15)

## 2024-05-11 LAB — ACETAMINOPHEN LEVEL: Acetaminophen (Tylenol), Serum: <10 ug/mL — ABNORMAL LOW (ref 10–30)

## 2024-05-11 NOTE — ED Provider Notes (Signed)
 [place] EMERGENCY DEPARTMENT AT [facility] Provider Note   CSN: [id number] Arrival date & time: 05/11/24  1845     Patient presents with: Paranoid   Lucas [name] is a 43 y.o. male with medical history of ADHD, alcohol abuse, bipolar disorder, chronic pain syndrome, GAD, paranoid schizophrenia.  The patient presents to the ED for concern of poisoning.  The patient reports that he feels he is being poisoned by the [place]  government.  He states that he has felt this way for over 1 year.  He reports that no one is doing anything to help me in regards to his concern that he is being poisoned.  He is unable to explain to me why he feels he is being poisoned.  He denies SI, HI, AVH.  He is alert and oriented x 3.  He reports that he is not compliant on psychiatric medications.  He shows no signs of acute psychosis.  He denies any medical complaints.  He is aware of the fact that he is supposed to take psychiatric medications due to past history of mental problems.  He reports that he has not taken these for unknown reasons.  He denies that he feels that his paranoia is related to his untreated underlying disease.  HPI     Prior to Admission medications   Medication Sig Start Date End Date Taking? Authorizing Provider  acetaminophen  (TYLENOL ) 325 MG tablet Take 2 tablets (650 mg total) by mouth every 6 (six) hours as needed. 02/09/24   Elnor Jayson LABOR, DO  ibuprofen  (ADVIL ) 600 MG tablet Take 1 tablet (600 mg total) by mouth every 6 (six) hours as needed. 02/09/24   Elnor Jayson LABOR, DO  ipratropium-albuterol  (DUONEB) 0.5-2.5 (3) MG/3ML SOLN Take 3 mLs by nebulization every 6 (six) hours. Patient not taking: Reported on 06/29/2023 12/25/18   Tammy Sor, PA-C  OLANZapine  zydis (ZYPREXA ) 5 MG disintegrating tablet Take 1 tablet (5 mg total) by mouth at bedtime. 07/04/23   Dasie Ellouise CROME, FNP  oxyCODONE  (ROXICODONE ) 5 MG immediate release tablet Take 1 tablet (5 mg total) by mouth  every 6 (six) hours as needed for severe pain (pain score 7-10). 02/09/24   Elnor Jayson LABOR, DO    Allergies: Patient has no known allergies.    Review of Systems  All other systems reviewed and are negative.   Updated Vital Signs BP (!) 146/108 (BP Location: Left Arm)   Pulse 95   Temp 98.3 F (36.8 C) (Oral)   Resp 18   SpO2 99%   Physical Exam Vitals and nursing note reviewed.  Constitutional:      General: He is not in acute distress.    Appearance: He is well-developed.  HENT:     Head: Normocephalic and atraumatic.   Eyes:     Conjunctiva/sclera: Conjunctivae normal.    Cardiovascular:     Rate and Rhythm: Normal rate and regular rhythm.     Heart sounds: No murmur heard. Pulmonary:     Effort: Pulmonary effort is normal. No respiratory distress.     Breath sounds: Normal breath sounds.  Abdominal:     Palpations: Abdomen is soft.     Tenderness: There is no abdominal tenderness.   Musculoskeletal:        General: No swelling.     Cervical back: Neck supple.   Skin:    General: Skin is warm and dry.     Capillary Refill: Capillary refill takes less than 2  seconds.   Neurological:     Mental Status: He is alert and oriented to person, place, and time. Mental status is at baseline.   Psychiatric:        Mood and Affect: Mood normal.     Comments: Not responding to internal stimuli.  Alert and oriented x 4.  No SI, HI, AVH.     (all labs ordered are listed, but only abnormal results are displayed) Labs Reviewed  COMPREHENSIVE METABOLIC PANEL WITH GFR - Abnormal; Notable for the following components:      Result Value   Glucose, Bld 101 (*)    All other components within normal limits  ETHANOL - Abnormal; Notable for the following components:   Alcohol, Ethyl (B) 111 (*)    All other components within normal limits  SALICYLATE LEVEL - Abnormal; Notable for the following components:   Salicylate Lvl <7.0 (*)    All other components within normal  limits  ACETAMINOPHEN  LEVEL - Abnormal; Notable for the following components:   Acetaminophen  (Tylenol ), Serum <10 (*)    All other components within normal limits  CBC WITH DIFFERENTIAL/PLATELET  RAPID URINE DRUG SCREEN, HOSP PERFORMED    EKG: None  Radiology: No results found.   Procedures   Medications Ordered in the ED - No data to display  Medical Decision Making Amount and/or Complexity of Data Reviewed Labs: ordered.   43 year old male presenting for evaluation/concern of poisoning.  Please see HPI for further details.  On examination the patient is afebrile and nontachycardic.  His lung sounds are clear bilaterally, he is not hypoxic.  Abdomen soft and compressible.  Neurological examinations at baseline.  Overall he is nontoxic in appearance.  Patient is slightly hypertensive on intake at 132/96.  Vital signs otherwise normal.  Cardiopulmonary examination unremarkable.  Psychiatric interview concerning for delusional behavior however patient is calm and cooperative, pleasant, easily directable.  Does not appear to be responding internal stimuli.  He is alert and oriented x 4.  He denies SI, HI, AVH.  No evidence of the patient being a danger to himself or others at this time.  No evidence of SI or HI on interview.  There is low concern for emergent psychiatric condition that would require emergent ED workup.  I recommended close outpatient follow-up with behavioral urgent care.  He voiced understanding of my instructions.  He is well-appearing and discharged in stable condition.  Discussed with attending Dr. Trine.    Final diagnoses:  Delusions [facility])    ED Discharge Orders     None          Sylvia, Kondracki 05/11/24 2351    Trine Raynell Moder, MD 05/12/24 [phone]
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
## 2024-05-11 NOTE — Discharge Instructions (Addendum)
 It was a pleasure taking part in your care.  Please follow-up with the behavioral health urgent care on [address] for further management and care.  Please return to the ED with any new or worsening symptoms such as desire to hurt yourself, hurt others or hallucinations.

## 2024-05-12 ENCOUNTER — Emergency Department (HOSPITAL_COMMUNITY): Admission: EM | Admit: 2024-05-12 | Discharge: 2024-05-12 | Discharge status: Against Medical Advice

## 2024-05-12 LAB — RAPID URINE DRUG SCREEN, HOSP PERFORMED
Amphetamines: NOT DETECTED
Barbiturates: NOT DETECTED
Benzodiazepines: NOT DETECTED
Cocaine: NOT DETECTED
Opiates: NOT DETECTED
Tetrahydrocannabinol: NOT DETECTED

## 2024-05-12 NOTE — ED Notes (Signed)
 Patient is malingering per the provider.

## 2024-05-13 ENCOUNTER — Emergency Department (HOSPITAL_COMMUNITY)

## 2024-05-13 ENCOUNTER — Inpatient Hospital Stay (HOSPITAL_COMMUNITY)
Admission: EM | Admit: 2024-05-13 | Discharge: 2024-06-08 | DRG: 207 | Disposition: A | Discharge status: Other Acute Inpatient Hospital | Attending: Surgery | Admitting: Surgery

## 2024-05-13 ENCOUNTER — Encounter (HOSPITAL_COMMUNITY): Payer: Self-pay

## 2024-05-13 DIAGNOSIS — S066XAA Traumatic subarachnoid hemorrhage with loss of consciousness status unknown, initial encounter: Secondary | ICD-10-CM

## 2024-05-13 DIAGNOSIS — S0101XA Laceration without foreign body of scalp, initial encounter: Secondary | ICD-10-CM

## 2024-05-13 DIAGNOSIS — T1490XA Injury, unspecified, initial encounter: Secondary | ICD-10-CM | POA: Diagnosis present

## 2024-05-13 DIAGNOSIS — S065XAA Traumatic subdural hemorrhage with loss of consciousness status unknown, initial encounter: Principal | ICD-10-CM

## 2024-05-13 DIAGNOSIS — S0291XA Unspecified fracture of skull, initial encounter for closed fracture: Secondary | ICD-10-CM

## 2024-05-13 DIAGNOSIS — E440 Moderate protein-calorie malnutrition: Secondary | ICD-10-CM | POA: Insufficient documentation

## 2024-05-13 DIAGNOSIS — Z781 Physical restraint status: Secondary | ICD-10-CM | POA: Diagnosis not present

## 2024-05-13 DIAGNOSIS — S42031A Displaced fracture of lateral end of right clavicle, initial encounter for closed fracture: Secondary | ICD-10-CM | POA: Diagnosis present

## 2024-05-13 DIAGNOSIS — R739 Hyperglycemia, unspecified: Secondary | ICD-10-CM | POA: Diagnosis present

## 2024-05-13 DIAGNOSIS — J151 Pneumonia due to Pseudomonas: Secondary | ICD-10-CM | POA: Diagnosis present

## 2024-05-13 DIAGNOSIS — E876 Hypokalemia: Secondary | ICD-10-CM | POA: Diagnosis present

## 2024-05-13 DIAGNOSIS — Z23 Encounter for immunization: Secondary | ICD-10-CM

## 2024-05-13 DIAGNOSIS — R402222 Coma scale, best verbal response, incomprehensible words, at arrival to emergency department: Secondary | ICD-10-CM | POA: Diagnosis present

## 2024-05-13 DIAGNOSIS — Z6825 Body mass index (BMI) 25.0-25.9, adult: Secondary | ICD-10-CM | POA: Diagnosis not present

## 2024-05-13 DIAGNOSIS — I959 Hypotension, unspecified: Secondary | ICD-10-CM | POA: Diagnosis present

## 2024-05-13 DIAGNOSIS — R402112 Coma scale, eyes open, never, at arrival to emergency department: Secondary | ICD-10-CM | POA: Diagnosis present

## 2024-05-13 DIAGNOSIS — J9601 Acute respiratory failure with hypoxia: Secondary | ICD-10-CM | POA: Diagnosis present

## 2024-05-13 DIAGNOSIS — D62 Acute posthemorrhagic anemia: Secondary | ICD-10-CM | POA: Diagnosis present

## 2024-05-13 DIAGNOSIS — E8809 Other disorders of plasma-protein metabolism, not elsewhere classified: Secondary | ICD-10-CM | POA: Diagnosis present

## 2024-05-13 DIAGNOSIS — R402352 Coma scale, best motor response, localizes pain, at arrival to emergency department: Secondary | ICD-10-CM | POA: Diagnosis present

## 2024-05-13 DIAGNOSIS — J9602 Acute respiratory failure with hypercapnia: Secondary | ICD-10-CM | POA: Diagnosis present

## 2024-05-13 DIAGNOSIS — S0211GA Other fracture of occiput, right side, initial encounter for closed fracture: Secondary | ICD-10-CM | POA: Diagnosis present

## 2024-05-13 DIAGNOSIS — S2241XA Multiple fractures of ribs, right side, initial encounter for closed fracture: Principal | ICD-10-CM | POA: Diagnosis present

## 2024-05-13 DIAGNOSIS — Z9151 Personal history of suicidal behavior: Secondary | ICD-10-CM

## 2024-05-13 DIAGNOSIS — R339 Retention of urine, unspecified: Secondary | ICD-10-CM | POA: Diagnosis present

## 2024-05-13 DIAGNOSIS — R1310 Dysphagia, unspecified: Secondary | ICD-10-CM | POA: Diagnosis present

## 2024-05-13 DIAGNOSIS — R0602 Shortness of breath: Secondary | ICD-10-CM | POA: Diagnosis present

## 2024-05-13 HISTORY — DX: Alcohol abuse, uncomplicated: F10.10

## 2024-05-13 HISTORY — DX: Tobacco use: Z72.0

## 2024-05-13 HISTORY — DX: Other psychoactive substance abuse, uncomplicated: F19.10

## 2024-05-13 HISTORY — DX: Paranoid schizophrenia: F20.0

## 2024-05-13 HISTORY — DX: Anxiety disorder, unspecified: F41.9

## 2024-05-13 HISTORY — DX: Bipolar disorder, unspecified: F31.9

## 2024-05-13 LAB — I-STAT ARTERIAL BLOOD GAS, ED
Acid-base deficit: 5 mmol/L — ABNORMAL HIGH (ref 0.0–2.0)
Bicarbonate: 27 mmol/L (ref 20.0–28.0)
Calcium, Ion: 1.21 mmol/L (ref 1.15–1.40)
HCT: 39 % (ref 39.0–52.0)
Hemoglobin: 13.3 g/dL (ref 13.0–17.0)
O2 Saturation: 100 %
Patient temperature: 99.4
Potassium: 3.2 mmol/L — ABNORMAL LOW (ref 3.5–5.1)
Sodium: 138 mmol/L (ref 135–145)
TCO2: 30 mmol/L (ref 22–32)
pCO2 arterial: 86.8 mmHg (ref 32–48)
pH, Arterial: 7.104 — CL (ref 7.35–7.45)
pO2, Arterial: 553 mmHg — ABNORMAL HIGH (ref 83–108)

## 2024-05-13 LAB — I-STAT CHEM 8, ED
BUN: 11 mg/dL (ref 6–20)
Calcium, Ion: 1.09 mmol/L — ABNORMAL LOW (ref 1.15–1.40)
Chloride: 102 mmol/L (ref 98–111)
Creatinine, Ser: 1 mg/dL (ref 0.61–1.24)
Glucose, Bld: 305 mg/dL — ABNORMAL HIGH (ref 70–99)
HCT: 44 % (ref 39.0–52.0)
Hemoglobin: 15 g/dL (ref 13.0–17.0)
Potassium: 3.2 mmol/L — ABNORMAL LOW (ref 3.5–5.1)
Sodium: 135 mmol/L (ref 135–145)
TCO2: 12 mmol/L — ABNORMAL LOW (ref 22–32)

## 2024-05-13 LAB — POCT I-STAT 7, (LYTES, BLD GAS, ICA,H+H)
Acid-base deficit: 4 mmol/L — ABNORMAL HIGH (ref 0.0–2.0)
Bicarbonate: 22 mmol/L (ref 20.0–28.0)
Calcium, Ion: 1.15 mmol/L (ref 1.15–1.40)
HCT: 30 % — ABNORMAL LOW (ref 39.0–52.0)
Hemoglobin: 10.2 g/dL — ABNORMAL LOW (ref 13.0–17.0)
O2 Saturation: 99 %
Patient temperature: 99.3
Potassium: 3.5 mmol/L (ref 3.5–5.1)
Sodium: 136 mmol/L (ref 135–145)
TCO2: 23 mmol/L (ref 22–32)
pCO2 arterial: 44.9 mmHg (ref 32–48)
pH, Arterial: 7.3 — ABNORMAL LOW (ref 7.35–7.45)
pO2, Arterial: 137 mmHg — ABNORMAL HIGH (ref 83–108)

## 2024-05-13 LAB — COMPREHENSIVE METABOLIC PANEL WITH GFR
ALT: 45 U/L — ABNORMAL HIGH (ref 0–44)
AST: 116 U/L — ABNORMAL HIGH (ref 15–41)
Albumin: 3.7 g/dL (ref 3.5–5.0)
Alkaline Phosphatase: 87 U/L (ref 38–126)
Anion gap: 24 — ABNORMAL HIGH (ref 5–15)
BUN: 11 mg/dL (ref 6–20)
CO2: 9 mmol/L — ABNORMAL LOW (ref 22–32)
Calcium: 8.2 mg/dL — ABNORMAL LOW (ref 8.9–10.3)
Chloride: 102 mmol/L (ref 98–111)
Creatinine, Ser: 1.21 mg/dL (ref 0.61–1.24)
GFR, Estimated: >60 mL/min (ref >60)
Glucose, Bld: 320 mg/dL — ABNORMAL HIGH (ref 70–99)
Potassium: 3.2 mmol/L — ABNORMAL LOW (ref 3.5–5.1)
Sodium: 135 mmol/L (ref 135–145)
Total Bilirubin: 0.9 mg/dL (ref 0.0–1.2)
Total Protein: 6.7 g/dL (ref 6.5–8.1)

## 2024-05-13 LAB — I-STAT CG4 LACTIC ACID, ED: Lactic Acid, Venous: 14.2 mmol/L (ref 0.5–1.9)

## 2024-05-13 LAB — CBC
HCT: 43.3 % (ref 39.0–52.0)
Hemoglobin: 14.2 g/dL (ref 13.0–17.0)
MCH: 31.5 pg (ref 26.0–34.0)
MCHC: 32.8 g/dL (ref 30.0–36.0)
MCV: 96 fL (ref 80.0–100.0)
Platelets: 244 K/uL (ref 150–400)
RBC: 4.51 MIL/uL (ref 4.22–5.81)
RDW: 14 % (ref 11.5–15.5)
WBC: 15.1 K/uL — ABNORMAL HIGH (ref 4.0–10.5)
nRBC: 0 % (ref 0.0–0.2)

## 2024-05-13 LAB — ETHANOL: Alcohol, Ethyl (B): <15 mg/dL (ref <15)

## 2024-05-13 LAB — RAPID URINE DRUG SCREEN, HOSP PERFORMED
Amphetamines: NOT DETECTED
Barbiturates: NOT DETECTED
Benzodiazepines: POSITIVE — AB
Cocaine: NOT DETECTED
Opiates: NOT DETECTED
Tetrahydrocannabinol: NOT DETECTED

## 2024-05-13 LAB — PROTIME-INR
INR: 1.3 — ABNORMAL HIGH (ref 0.8–1.2)
Prothrombin Time: 16.7 s — ABNORMAL HIGH (ref 11.4–15.2)

## 2024-05-13 LAB — MRSA NEXT GEN BY PCR, NASAL: MRSA by PCR Next Gen: NOT DETECTED

## 2024-05-13 LAB — GLUCOSE, CAPILLARY
Glucose-Capillary: 149 mg/dL — ABNORMAL HIGH (ref 70–99)
Glucose-Capillary: 144 mg/dL — ABNORMAL HIGH (ref 70–99)

## 2024-05-13 LAB — SAMPLE TO BLOOD BANK

## 2024-05-13 MED ORDER — ORAL CARE MOUTH RINSE
15.0000 mL | OROMUCOSAL | Status: DC
  Administered 2024-05-13 – 2024-05-16 (×37): 15 mL via OROMUCOSAL

## 2024-05-13 MED ORDER — FENTANYL BOLUS VIA INFUSION: 25.0000 ug | INTRAVENOUS | Status: DC | PRN

## 2024-05-13 MED ORDER — HYDRALAZINE HCL 20 MG/ML IJ SOLN
10.0000 mg | INTRAMUSCULAR | Status: DC | PRN
  Filled 2024-05-13: qty 1

## 2024-05-13 MED ORDER — METHOCARBAMOL 1000 MG/10ML IJ SOLN
500.0000 mg | Freq: Three times a day (TID) | INTRAMUSCULAR | Status: DC
  Administered 2024-05-13 – 2024-05-14 (×2): 500 mg via INTRAVENOUS
  Filled 2024-05-13 (×2): qty 10

## 2024-05-13 MED ORDER — CHLORHEXIDINE GLUCONATE CLOTH 2 % EX PADS
6.0000 | MEDICATED_PAD | Freq: Every day | CUTANEOUS | Status: DC
  Administered 2024-05-13 – 2024-06-08 (×24): 6 via TOPICAL

## 2024-05-13 MED ORDER — SUCCINYLCHOLINE CHLORIDE 20 MG/ML IJ SOLN
INTRAMUSCULAR | Status: AC | PRN
  Administered 2024-05-13: 160 mg via INTRAVENOUS

## 2024-05-13 MED ORDER — CEFAZOLIN SODIUM-DEXTROSE 2-4 GM/100ML-% IV SOLN
2.0000 g | Freq: Once | INTRAVENOUS | Status: AC
  Administered 2024-05-13: 2 g via INTRAVENOUS

## 2024-05-13 MED ORDER — DOCUSATE SODIUM 50 MG/5ML PO LIQD
100.0000 mg | Freq: Two times a day (BID) | ORAL | Status: DC
  Administered 2024-05-13 – 2024-06-01 (×35): 100 mg
  Filled 2024-05-13 (×35): qty 10

## 2024-05-13 MED ORDER — FENTANYL BOLUS VIA INFUSION
25.0000 ug | INTRAVENOUS | Status: DC | PRN
  Administered 2024-05-13: 50 ug via INTRAVENOUS
  Administered 2024-05-14 (×2): 100 ug via INTRAVENOUS
  Administered 2024-05-14: 50 ug via INTRAVENOUS
  Administered 2024-05-14 (×3): 100 ug via INTRAVENOUS
  Administered 2024-05-14: 75 ug via INTRAVENOUS
  Administered 2024-05-14 – 2024-05-26 (×10): 100 ug via INTRAVENOUS

## 2024-05-13 MED ORDER — ETOMIDATE 2 MG/ML IV SOLN
INTRAVENOUS | Status: AC | PRN
  Administered 2024-05-13: 20 mg via INTRAVENOUS

## 2024-05-13 MED ORDER — SODIUM CHLORIDE 0.9 % IV BOLUS
1000.0000 mL | Freq: Once | INTRAVENOUS | Status: AC
  Administered 2024-05-13: 1000 mL via INTRAVENOUS

## 2024-05-13 MED ORDER — LACTATED RINGERS IV SOLN: INTRAVENOUS | Status: DC

## 2024-05-13 MED ORDER — METHOCARBAMOL 500 MG PO TABS: 500.0000 mg | ORAL_TABLET | Freq: Three times a day (TID) | ORAL | Status: DC

## 2024-05-13 MED ORDER — IOHEXOL 350 MG/ML SOLN
75.0000 mL | Freq: Once | INTRAVENOUS | Status: AC | PRN
  Administered 2024-05-13: 75 mL via INTRAVENOUS

## 2024-05-13 MED ORDER — METOPROLOL TARTRATE 5 MG/5ML IV SOLN
5.0000 mg | Freq: Four times a day (QID) | INTRAVENOUS | Status: DC | PRN
  Administered 2024-06-01 – 2024-06-03 (×2): 5 mg via INTRAVENOUS
  Filled 2024-05-13 (×3): qty 5

## 2024-05-13 MED ORDER — SODIUM CHLORIDE 0.9 % IV SOLN: INTRAVENOUS | Status: AC

## 2024-05-13 MED ORDER — DOCUSATE SODIUM 100 MG PO CAPS: 100.0000 mg | ORAL_CAPSULE | Freq: Two times a day (BID) | ORAL | Status: DC

## 2024-05-13 MED ORDER — TETANUS-DIPHTH-ACELL PERTUSSIS 5-2.5-18.5 LF-MCG/0.5 IM SUSY
0.5000 mL | PREFILLED_SYRINGE | Freq: Once | INTRAMUSCULAR | Status: AC
  Administered 2024-05-13: 0.5 mL via INTRAMUSCULAR

## 2024-05-13 MED ORDER — SODIUM CHLORIDE 0.9 % IV SOLN: Freq: Once | INTRAVENOUS | Status: AC

## 2024-05-13 MED ORDER — PROPOFOL 1000 MG/100ML IV EMUL
INTRAVENOUS | Status: AC
  Administered 2024-05-13: 20 ug/kg/min via INTRAVENOUS
  Filled 2024-05-13: qty 100

## 2024-05-13 MED ORDER — POLYETHYLENE GLYCOL 3350 17 G PO PACK
17.0000 g | PACK | Freq: Every day | ORAL | Status: DC | PRN
  Administered 2024-05-14: 17 g via ORAL
  Filled 2024-05-13: qty 1

## 2024-05-13 MED ORDER — FENTANYL CITRATE PF 50 MCG/ML IJ SOSY
PREFILLED_SYRINGE | INTRAMUSCULAR | Status: AC
  Administered 2024-05-13: 100 ug via INTRAVENOUS
  Filled 2024-05-13: qty 2

## 2024-05-13 MED ORDER — FENTANYL 2500MCG IN NS 250ML (10MCG/ML) PREMIX INFUSION
0.0000 ug/h | INTRAVENOUS | Status: DC
  Administered 2024-05-13: 100 ug/h via INTRAVENOUS
  Filled 2024-05-13: qty 250

## 2024-05-13 MED ORDER — ORAL CARE MOUTH RINSE: 15.0000 mL | OROMUCOSAL | Status: DC | PRN

## 2024-05-13 MED ORDER — PROPOFOL 1000 MG/100ML IV EMUL
0.0000 ug/kg/min | INTRAVENOUS | Status: DC
  Administered 2024-05-13: 35 ug/kg/min via INTRAVENOUS
  Administered 2024-05-14: 45 ug/kg/min via INTRAVENOUS
  Filled 2024-05-13 (×2): qty 100

## 2024-05-13 MED ORDER — ONDANSETRON 4 MG PO TBDP: 4.0000 mg | ORAL_TABLET | Freq: Four times a day (QID) | ORAL | Status: DC | PRN

## 2024-05-13 MED ORDER — FENTANYL 2500MCG IN NS 250ML (10MCG/ML) PREMIX INFUSION
0.0000 ug/h | INTRAVENOUS | Status: DC
  Administered 2024-05-13: 150 ug/h via INTRAVENOUS
  Administered 2024-05-14: 125 ug/h via INTRAVENOUS
  Administered 2024-05-15: 75 ug/h via INTRAVENOUS
  Administered 2024-05-15: 400 ug/h via INTRAVENOUS
  Administered 2024-05-16: 325 ug/h via INTRAVENOUS
  Administered 2024-05-16: 400 ug/h via INTRAVENOUS
  Administered 2024-05-17: 50 ug/h via INTRAVENOUS
  Filled 2024-05-13 (×6): qty 250

## 2024-05-13 MED ORDER — ONDANSETRON HCL 4 MG/2ML IJ SOLN
4.0000 mg | Freq: Four times a day (QID) | INTRAMUSCULAR | Status: DC | PRN
  Administered 2024-05-23 – 2024-06-03 (×5): 4 mg via INTRAVENOUS
  Filled 2024-05-13 (×6): qty 2

## 2024-05-13 MED ORDER — FENTANYL CITRATE PF 50 MCG/ML IJ SOSY: 100.0000 ug | PREFILLED_SYRINGE | Freq: Once | INTRAMUSCULAR | Status: AC
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
## 2024-05-13 NOTE — Progress Notes (Signed)
 Orthopedic Tech Progress Note Patient Details:  Lucas [name] 09-Jul-1981 [id number] Level 1 Trauma  Patient ID: Lucas [name] [name], male   DOB: [birth year], 43 y.o.   MRN: [id number]  [name] Bar 05/13/2024, 6:12 PM

## 2024-05-13 NOTE — ED Notes (Signed)
 Difficulty intubating, provider trying smaller tube.
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
## 2024-05-13 NOTE — Progress Notes (Signed)
 RT NOTE: RT transported PT on ventilator from ED to room 4N17 with no complications.

## 2024-05-14 ENCOUNTER — Inpatient Hospital Stay (HOSPITAL_COMMUNITY)

## 2024-05-14 ENCOUNTER — Other Ambulatory Visit: Payer: Self-pay

## 2024-05-14 ENCOUNTER — Encounter (HOSPITAL_COMMUNITY): Payer: Self-pay

## 2024-05-14 LAB — LACTIC ACID, PLASMA: Lactic Acid, Venous: 1.3 mmol/L (ref 0.5–1.9)

## 2024-05-14 LAB — CBC
HCT: 30 % — ABNORMAL LOW (ref 39.0–52.0)
Hemoglobin: 10.3 g/dL — ABNORMAL LOW (ref 13.0–17.0)
MCH: 31.5 pg (ref 26.0–34.0)
MCHC: 34.3 g/dL (ref 30.0–36.0)
MCV: 91.7 fL (ref 80.0–100.0)
Platelets: 164 K/uL (ref 150–400)
RBC: 3.27 MIL/uL — ABNORMAL LOW (ref 4.22–5.81)
RDW: 14.3 % (ref 11.5–15.5)
WBC: 11.3 K/uL — ABNORMAL HIGH (ref 4.0–10.5)
nRBC: 0 % (ref 0.0–0.2)

## 2024-05-14 LAB — MAGNESIUM: Magnesium: 1.9 mg/dL (ref 1.7–2.4)

## 2024-05-14 LAB — BASIC METABOLIC PANEL WITH GFR
Anion gap: 10 (ref 5–15)
BUN: 11 mg/dL (ref 6–20)
CO2: 20 mmol/L — ABNORMAL LOW (ref 22–32)
Calcium: 7.7 mg/dL — ABNORMAL LOW (ref 8.9–10.3)
Chloride: 107 mmol/L (ref 98–111)
Creatinine, Ser: 0.91 mg/dL (ref 0.61–1.24)
GFR, Estimated: >60 mL/min (ref >60)
Glucose, Bld: 107 mg/dL — ABNORMAL HIGH (ref 70–99)
Potassium: 3.5 mmol/L (ref 3.5–5.1)
Sodium: 137 mmol/L (ref 135–145)

## 2024-05-14 LAB — GLUCOSE, CAPILLARY
Glucose-Capillary: 120 mg/dL — ABNORMAL HIGH (ref 70–99)
Glucose-Capillary: 140 mg/dL — ABNORMAL HIGH (ref 70–99)
Glucose-Capillary: 127 mg/dL — ABNORMAL HIGH (ref 70–99)

## 2024-05-14 LAB — PHOSPHORUS: Phosphorus: 4.1 mg/dL (ref 2.5–4.6)

## 2024-05-14 LAB — HIV ANTIBODY (ROUTINE TESTING W REFLEX): HIV Screen 4th Generation wRfx: NONREACTIVE

## 2024-05-14 MED ORDER — LEVETIRACETAM (KEPPRA) 500 MG/5 ML ADULT IV PUSH
500.0000 mg | Freq: Two times a day (BID) | INTRAVENOUS | Status: DC
  Administered 2024-05-14 – 2024-05-17 (×8): 500 mg via INTRAVENOUS
  Filled 2024-05-14 (×8): qty 5

## 2024-05-14 MED ORDER — OLANZAPINE 5 MG PO TABS
5.0000 mg | ORAL_TABLET | Freq: Two times a day (BID) | ORAL | Status: DC
  Administered 2024-05-14 – 2024-05-16 (×6): 5 mg
  Filled 2024-05-14 (×8): qty 1

## 2024-05-14 MED ORDER — DEXMEDETOMIDINE HCL IN NACL 400 MCG/100ML IV SOLN: 0.0000 ug/kg/h | INTRAVENOUS | Status: DC

## 2024-05-14 MED ORDER — DEXMEDETOMIDINE HCL IN NACL 400 MCG/100ML IV SOLN
0.0000 ug/kg/h | INTRAVENOUS | Status: DC
  Administered 2024-05-14: 0.7 ug/kg/h via INTRAVENOUS
  Administered 2024-05-14: 0.4 ug/kg/h via INTRAVENOUS
  Administered 2024-05-14: 0.5 ug/kg/h via INTRAVENOUS
  Administered 2024-05-15: 0.8 ug/kg/h via INTRAVENOUS
  Administered 2024-05-15: 1.2 ug/kg/h via INTRAVENOUS
  Administered 2024-05-16: 0.9 ug/kg/h via INTRAVENOUS
  Administered 2024-05-16 (×2): 1.2 ug/kg/h via INTRAVENOUS
  Administered 2024-05-16: 0.4 ug/kg/h via INTRAVENOUS
  Administered 2024-05-17 (×3): 1.2 ug/kg/h via INTRAVENOUS
  Filled 2024-05-14: qty 100
  Filled 2024-05-14: qty 200
  Filled 2024-05-14 (×11): qty 100

## 2024-05-14 MED ORDER — PIVOT 1.5 CAL PO LIQD: 1000.0000 mL | ORAL | Status: DC

## 2024-05-14 MED ORDER — POTASSIUM CHLORIDE 20 MEQ PO PACK
40.0000 meq | PACK | Freq: Once | ORAL | Status: AC
  Administered 2024-05-14: 40 meq
  Filled 2024-05-14: qty 2

## 2024-05-14 MED ORDER — CLONAZEPAM 1 MG PO TABS
1.0000 mg | ORAL_TABLET | Freq: Two times a day (BID) | ORAL | Status: DC
  Administered 2024-05-14 – 2024-05-16 (×6): 1 mg
  Filled 2024-05-14 (×6): qty 1

## 2024-05-14 MED ORDER — IOHEXOL 350 MG/ML SOLN
75.0000 mL | Freq: Once | INTRAVENOUS | Status: AC | PRN
  Administered 2024-05-14: 75 mL via INTRAVENOUS

## 2024-05-14 MED ORDER — OLANZAPINE 5 MG PO TABS
5.0000 mg | ORAL_TABLET | Freq: Every day | ORAL | Status: DC
  Filled 2024-05-14: qty 1

## 2024-05-14 MED ORDER — QUETIAPINE FUMARATE 25 MG PO TABS: 50.0000 mg | ORAL_TABLET | Freq: Two times a day (BID) | ORAL | Status: DC

## 2024-05-14 MED ORDER — CALCIUM GLUCONATE-NACL 2-0.675 GM/100ML-% IV SOLN
2.0000 g | Freq: Once | INTRAVENOUS | Status: AC
  Administered 2024-05-14: 2000 mg via INTRAVENOUS
  Filled 2024-05-14: qty 100

## 2024-05-14 MED ORDER — ADULT MULTIVITAMIN W/MINERALS CH
1.0000 | ORAL_TABLET | Freq: Every day | ORAL | Status: DC
  Administered 2024-05-14 – 2024-06-04 (×22): 1
  Filled 2024-05-14 (×22): qty 1

## 2024-05-14 MED ORDER — POLYETHYLENE GLYCOL 3350 17 G PO PACK: 17.0000 g | PACK | Freq: Every day | ORAL | Status: DC | PRN

## 2024-05-14 MED ORDER — METHOCARBAMOL 500 MG PO TABS
1000.0000 mg | ORAL_TABLET | Freq: Three times a day (TID) | ORAL | Status: AC
  Administered 2024-05-14 – 2024-05-16 (×7): 1000 mg
  Filled 2024-05-14 (×7): qty 2

## 2024-05-14 MED ORDER — SODIUM CHLORIDE 0.9 % IV BOLUS
500.0000 mL | Freq: Once | INTRAVENOUS | Status: AC
  Administered 2024-05-14: 500 mL via INTRAVENOUS

## 2024-05-14 MED ORDER — ACETAMINOPHEN 500 MG PO TABS
1000.0000 mg | ORAL_TABLET | Freq: Four times a day (QID) | ORAL | Status: DC
  Administered 2024-05-14 – 2024-06-04 (×74): 1000 mg
  Filled 2024-05-14 (×77): qty 2

## 2024-05-14 MED ORDER — OXYCODONE HCL 5 MG PO TABS
5.0000 mg | ORAL_TABLET | ORAL | Status: DC | PRN
  Administered 2024-05-14 – 2024-05-17 (×9): 10 mg
  Filled 2024-05-14 (×9): qty 2

## 2024-05-14 MED ORDER — FOLIC ACID 1 MG PO TABS
1.0000 mg | ORAL_TABLET | Freq: Every day | ORAL | Status: DC
  Administered 2024-05-14 – 2024-06-08 (×26): 1 mg
  Filled 2024-05-14 (×26): qty 1

## 2024-05-14 MED ORDER — METHOCARBAMOL 500 MG PO TABS: 500.0000 mg | ORAL_TABLET | Freq: Three times a day (TID) | ORAL | Status: DC

## 2024-05-14 MED ORDER — METHOCARBAMOL 1000 MG/10ML IJ SOLN: 500.0000 mg | Freq: Three times a day (TID) | INTRAMUSCULAR | Status: DC

## 2024-05-14 MED ORDER — SODIUM CHLORIDE 0.9 % IV BOLUS
1000.0000 mL | Freq: Once | INTRAVENOUS | Status: AC
  Administered 2024-05-14: 1000 mL via INTRAVENOUS

## 2024-05-14 MED ORDER — PIVOT 1.5 CAL PO LIQD
1000.0000 mL | ORAL | Status: DC
  Administered 2024-05-14 – 2024-06-04 (×22): 1000 mL
  Filled 2024-05-14 (×2): qty 1000

## 2024-05-14 MED ORDER — THIAMINE MONONITRATE 100 MG PO TABS
100.0000 mg | ORAL_TABLET | Freq: Every day | ORAL | Status: DC
  Administered 2024-05-14 – 2024-06-04 (×22): 100 mg
  Filled 2024-05-14 (×22): qty 1

## 2024-05-14 NOTE — Progress Notes (Signed)
 Patient with no urine output for the last 3 hours per day RN. Second fluid bolus infusing at this time. Upon assessment, patient is very agitated and lower abdomen is taught and painful to the touch. This RN attempted to flush 10 ml through foley port, met a lot of resistance, and foley detached from the foley bag and broke the seal. Trauma RN notified and the plan was to swap out the foley catheter. When the first foley balloon was deflated, patient voided an un measurable amount and pushed foley out. Foley replaced at this time and urine was noticed to have sediment in the new tube of the foley. The old foley was completely clogged at the tip with crystallized sediment. Trauma RN and MD made aware. Patient resting comfortably at this time.
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
## 2024-05-14 NOTE — Progress Notes (Signed)
 Pt transported on vent to CT and back to [id number] without complications.
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
## 2024-05-15 ENCOUNTER — Inpatient Hospital Stay (HOSPITAL_COMMUNITY)

## 2024-05-15 LAB — GLUCOSE, CAPILLARY
Glucose-Capillary: 123 mg/dL — ABNORMAL HIGH (ref 70–99)
Glucose-Capillary: 178 mg/dL — ABNORMAL HIGH (ref 70–99)
Glucose-Capillary: 128 mg/dL — ABNORMAL HIGH (ref 70–99)
Glucose-Capillary: 166 mg/dL — ABNORMAL HIGH (ref 70–99)
Glucose-Capillary: 153 mg/dL — ABNORMAL HIGH (ref 70–99)
Glucose-Capillary: 171 mg/dL — ABNORMAL HIGH (ref 70–99)

## 2024-05-15 LAB — BASIC METABOLIC PANEL WITH GFR
Anion gap: 9 (ref 5–15)
BUN: 10 mg/dL (ref 6–20)
CO2: 23 mmol/L (ref 22–32)
Calcium: 8.1 mg/dL — ABNORMAL LOW (ref 8.9–10.3)
Chloride: 106 mmol/L (ref 98–111)
Creatinine, Ser: 0.71 mg/dL (ref 0.61–1.24)
GFR, Estimated: >60 mL/min (ref >60)
Glucose, Bld: 171 mg/dL — ABNORMAL HIGH (ref 70–99)
Potassium: 4 mmol/L (ref 3.5–5.1)
Sodium: 138 mmol/L (ref 135–145)

## 2024-05-15 LAB — CBC
HCT: 28.4 % — ABNORMAL LOW (ref 39.0–52.0)
Hemoglobin: 9.4 g/dL — ABNORMAL LOW (ref 13.0–17.0)
MCH: 31.4 pg (ref 26.0–34.0)
MCHC: 33.1 g/dL (ref 30.0–36.0)
MCV: 95 fL (ref 80.0–100.0)
Platelets: 167 K/uL (ref 150–400)
RBC: 2.99 MIL/uL — ABNORMAL LOW (ref 4.22–5.81)
RDW: 14.4 % (ref 11.5–15.5)
WBC: 13.6 K/uL — ABNORMAL HIGH (ref 4.0–10.5)
nRBC: 0 % (ref 0.0–0.2)

## 2024-05-15 LAB — MAGNESIUM: Magnesium: 1.8 mg/dL (ref 1.7–2.4)

## 2024-05-15 LAB — PHOSPHORUS: Phosphorus: 2.8 mg/dL (ref 2.5–4.6)

## 2024-05-15 MED ORDER — SODIUM PHOSPHATES 45 MMOLE/15ML IV SOLN
15.0000 mmol | Freq: Once | INTRAVENOUS | Status: AC
  Administered 2024-05-15: 15 mmol via INTRAVENOUS
  Filled 2024-05-15: qty 5

## 2024-05-15 MED ORDER — MORPHINE SULFATE (PF) 2 MG/ML IV SOLN
2.0000 mg | INTRAVENOUS | Status: DC | PRN
  Administered 2024-05-17 (×4): 4 mg via INTRAVENOUS
  Filled 2024-05-15 (×4): qty 2

## 2024-05-15 MED ORDER — POLYETHYLENE GLYCOL 3350 17 G PO PACK
17.0000 g | PACK | Freq: Every day | ORAL | Status: DC
  Administered 2024-05-15 – 2024-06-01 (×12): 17 g
  Filled 2024-05-15 (×12): qty 1

## 2024-05-15 MED ORDER — MAGNESIUM SULFATE 2 GM/50ML IV SOLN
2.0000 g | Freq: Once | INTRAVENOUS | Status: AC
  Administered 2024-05-15: 2 g via INTRAVENOUS
  Filled 2024-05-15: qty 50

## 2024-05-15 MED ORDER — SODIUM CHLORIDE 0.9 % IV SOLN: INTRAVENOUS | Status: DC

## 2024-05-15 MED ORDER — MIDAZOLAM HCL 2 MG/2ML IJ SOLN
INTRAMUSCULAR | Status: AC
  Filled 2024-05-15: qty 4

## 2024-05-15 MED ORDER — PANTOPRAZOLE SODIUM 40 MG IV SOLR
40.0000 mg | INTRAVENOUS | Status: DC
  Administered 2024-05-15 – 2024-05-31 (×17): 40 mg via INTRAVENOUS
  Filled 2024-05-15 (×18): qty 10

## 2024-05-15 MED ORDER — CALCIUM GLUCONATE-NACL 1-0.675 GM/50ML-% IV SOLN
1.0000 g | Freq: Once | INTRAVENOUS | Status: AC
  Administered 2024-05-15: 1000 mg via INTRAVENOUS
  Filled 2024-05-15: qty 50

## 2024-05-15 MED ORDER — MIDAZOLAM HCL 2 MG/2ML IJ SOLN
2.0000 mg | INTRAMUSCULAR | Status: DC | PRN
  Administered 2024-05-15: 4 mg via INTRAVENOUS
  Administered 2024-05-17: 2 mg via INTRAVENOUS
  Administered 2024-05-17 (×2): 4 mg via INTRAVENOUS
  Administered 2024-05-17: 2 mg via INTRAVENOUS
  Administered 2024-05-17: 4 mg via INTRAVENOUS
  Administered 2024-05-17: 2 mg via INTRAVENOUS
  Administered 2024-05-17 (×3): 4 mg via INTRAVENOUS
  Filled 2024-05-15 (×2): qty 2
  Filled 2024-05-15 (×6): qty 4
  Filled 2024-05-15: qty 2
  Filled 2024-05-15: qty 4
  Filled 2024-05-15: qty 2

## 2024-05-15 MED ORDER — ASPIRIN 325 MG PO TABS
325.0000 mg | ORAL_TABLET | Freq: Every day | ORAL | Status: DC
  Administered 2024-05-15 – 2024-05-16 (×2): 325 mg
  Filled 2024-05-15 (×2): qty 1

## 2024-05-15 NOTE — TOC CAGE-AID Note (Signed)
 Transition of Care [facility]) - CAGE-AID Screening   Patient Details  Name: EULIS SALAZAR MRN: [id number] Date of Birth: [birth year]  Transition of Care [facility]) CM/SW Contact:    Andreal Vultaggio E Lahela Woodin, LCSW Phone Number: 05/15/2024, 9:55 AM   Clinical Narrative: Currently intubated.   CAGE-AID Screening: Substance Abuse Screening unable to be completed due to: : Patient unable to participate

## 2024-05-15 NOTE — Progress Notes (Signed)
 Pt became very combative requiring fellow nurses on the unit to help me hold pt down as he was biting on his tube and shaking his head side to side also twisting his body against the wrist restraints to attempt to pull the ETT. He was able to get himself off the vent as the vent tube came detached several times. I was given versed orders by oncall MD  and pt also required me to titrate sedation up quick to maintain safety of staff and pt.
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
## 2024-05-15 NOTE — Procedures (Signed)
 Cortrak  Person Inserting Tube:  Louetta Roux, RD Tube Type:  Cortrak - 43 inches Tube Size:  10 Tube Location:  Left nare Initial Placement:  Stomach Secured by: Bridle Technique Used to Measure Tube Placement:  Marking at nare/corner of mouth Cortrak Secured At:  68 cm   Cortrak Tube Team Note:  Consult received to place a Cortrak feeding tube.   No x-ray is required. RN may begin using tube.   If the tube becomes dislodged please keep the tube and contact the Cortrak team at [email or web address] for replacement.  If after hours and replacement cannot be delayed, place a NG tube and confirm placement with an abdominal x-ray.    Ernestina Headland, MS, RD, LDN Registered Dietitian II Please see [email or web address] for contact information.
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
## 2024-05-16 ENCOUNTER — Inpatient Hospital Stay (HOSPITAL_COMMUNITY)

## 2024-05-16 ENCOUNTER — Other Ambulatory Visit: Payer: Self-pay

## 2024-05-16 ENCOUNTER — Encounter (HOSPITAL_COMMUNITY): Payer: Self-pay

## 2024-05-16 LAB — CBC
HCT: 24.9 % — ABNORMAL LOW (ref 39.0–52.0)
Hemoglobin: 8.4 g/dL — ABNORMAL LOW (ref 13.0–17.0)
MCH: 31.6 pg (ref 26.0–34.0)
MCHC: 33.7 g/dL (ref 30.0–36.0)
MCV: 93.6 fL (ref 80.0–100.0)
Platelets: 167 K/uL (ref 150–400)
RBC: 2.66 MIL/uL — ABNORMAL LOW (ref 4.22–5.81)
RDW: 14.4 % (ref 11.5–15.5)
WBC: 8.8 K/uL (ref 4.0–10.5)
nRBC: 0 % (ref 0.0–0.2)

## 2024-05-16 LAB — GLUCOSE, CAPILLARY
Glucose-Capillary: 154 mg/dL — ABNORMAL HIGH (ref 70–99)
Glucose-Capillary: 172 mg/dL — ABNORMAL HIGH (ref 70–99)
Glucose-Capillary: 147 mg/dL — ABNORMAL HIGH (ref 70–99)
Glucose-Capillary: 146 mg/dL — ABNORMAL HIGH (ref 70–99)
Glucose-Capillary: 152 mg/dL — ABNORMAL HIGH (ref 70–99)
Glucose-Capillary: 156 mg/dL — ABNORMAL HIGH (ref 70–99)

## 2024-05-16 LAB — BASIC METABOLIC PANEL WITH GFR
Anion gap: 8 (ref 5–15)
BUN: 11 mg/dL (ref 6–20)
CO2: 25 mmol/L (ref 22–32)
Calcium: 8.3 mg/dL — ABNORMAL LOW (ref 8.9–10.3)
Chloride: 110 mmol/L (ref 98–111)
Creatinine, Ser: 0.7 mg/dL (ref 0.61–1.24)
GFR, Estimated: >60 mL/min (ref >60)
Glucose, Bld: 137 mg/dL — ABNORMAL HIGH (ref 70–99)
Potassium: 3.7 mmol/L (ref 3.5–5.1)
Sodium: 143 mmol/L (ref 135–145)

## 2024-05-16 LAB — MAGNESIUM: Magnesium: 2.2 mg/dL (ref 1.7–2.4)

## 2024-05-16 LAB — PHOSPHORUS: Phosphorus: 2.5 mg/dL (ref 2.5–4.6)

## 2024-05-16 MED ORDER — ORAL CARE MOUTH RINSE
15.0000 mL | Freq: Four times a day (QID) | OROMUCOSAL | Status: DC
  Administered 2024-05-17 (×4): 15 mL via OROMUCOSAL

## 2024-05-16 MED ORDER — ASPIRIN 81 MG PO CHEW
81.0000 mg | CHEWABLE_TABLET | Freq: Every day | ORAL | Status: DC
  Administered 2024-05-17 – 2024-06-08 (×23): 81 mg
  Filled 2024-05-16 (×23): qty 1

## 2024-05-16 NOTE — Progress Notes (Signed)
 Pt cathed X 2 after bladder scan of 750 cc.  05/16/24 0640  Urine Measurement/Characteristics  Urinary Interventions Intermittent/Straight cath  Intermittent/Straight Cath (mL) 850 mL

## 2024-05-16 NOTE — Progress Notes (Signed)
Patient transported to and from CT without complications.  

## 2024-05-16 NOTE — Progress Notes (Signed)
 RT called to room after patient self extubated, placed on 12L salter. Currently tolerating well, RT will continue to monitor patient.
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
## 2024-05-16 NOTE — Progress Notes (Signed)
 Cathed x 1 time at this time  05/15/24 2351 05/16/24 0002  Urine Measurement/Characteristics  Urinary Interventions Bladder scan Intermittent/Straight cath  Bladder Scan Volume (mL) 736 mL  --   Intermittent/Straight Cath (mL)  --  800 mL
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
## 2024-05-17 ENCOUNTER — Inpatient Hospital Stay (HOSPITAL_COMMUNITY): Admitting: Anesthesiology

## 2024-05-17 ENCOUNTER — Inpatient Hospital Stay (HOSPITAL_COMMUNITY)

## 2024-05-17 ENCOUNTER — Encounter (HOSPITAL_COMMUNITY): Payer: Self-pay

## 2024-05-17 LAB — GLUCOSE, CAPILLARY
Glucose-Capillary: 149 mg/dL — ABNORMAL HIGH (ref 70–99)
Glucose-Capillary: 147 mg/dL — ABNORMAL HIGH (ref 70–99)
Glucose-Capillary: 149 mg/dL — ABNORMAL HIGH (ref 70–99)
Glucose-Capillary: 139 mg/dL — ABNORMAL HIGH (ref 70–99)
Glucose-Capillary: 132 mg/dL — ABNORMAL HIGH (ref 70–99)

## 2024-05-17 LAB — POCT I-STAT 7, (LYTES, BLD GAS, ICA,H+H)
Acid-Base Excess: 6 mmol/L — ABNORMAL HIGH (ref 0.0–2.0)
Bicarbonate: 30.5 mmol/L — ABNORMAL HIGH (ref 20.0–28.0)
Calcium, Ion: 1.23 mmol/L (ref 1.15–1.40)
HCT: 20 % — ABNORMAL LOW (ref 39.0–52.0)
Hemoglobin: 6.8 g/dL — CL (ref 13.0–17.0)
O2 Saturation: 98 %
Potassium: 3.8 mmol/L (ref 3.5–5.1)
Sodium: 144 mmol/L (ref 135–145)
TCO2: 32 mmol/L (ref 22–32)
pCO2 arterial: 46.6 mmHg (ref 32–48)
pH, Arterial: 7.424 (ref 7.35–7.45)
pO2, Arterial: 109 mmHg — ABNORMAL HIGH (ref 83–108)

## 2024-05-17 LAB — RENAL FUNCTION PANEL
Albumin: 2.4 g/dL — ABNORMAL LOW (ref 3.5–5.0)
Anion gap: 10 (ref 5–15)
BUN: 19 mg/dL (ref 6–20)
CO2: 28 mmol/L (ref 22–32)
Calcium: 8.7 mg/dL — ABNORMAL LOW (ref 8.9–10.3)
Chloride: 107 mmol/L (ref 98–111)
Creatinine, Ser: 0.72 mg/dL (ref 0.61–1.24)
GFR, Estimated: >60 mL/min (ref >60)
Glucose, Bld: 134 mg/dL — ABNORMAL HIGH (ref 70–99)
Phosphorus: 3.2 mg/dL (ref 2.5–4.6)
Potassium: 3.8 mmol/L (ref 3.5–5.1)
Sodium: 145 mmol/L (ref 135–145)

## 2024-05-17 LAB — BASIC METABOLIC PANEL WITH GFR
Anion gap: 8 (ref 5–15)
BUN: 18 mg/dL (ref 6–20)
CO2: 28 mmol/L (ref 22–32)
Calcium: 8.2 mg/dL — ABNORMAL LOW (ref 8.9–10.3)
Chloride: 106 mmol/L (ref 98–111)
Creatinine, Ser: 0.86 mg/dL (ref 0.61–1.24)
GFR, Estimated: >60 mL/min (ref >60)
Glucose, Bld: 152 mg/dL — ABNORMAL HIGH (ref 70–99)
Potassium: 3.7 mmol/L (ref 3.5–5.1)
Sodium: 142 mmol/L (ref 135–145)

## 2024-05-17 LAB — MAGNESIUM: Magnesium: 1.9 mg/dL (ref 1.7–2.4)

## 2024-05-17 LAB — CBC
HCT: 23.7 % — ABNORMAL LOW (ref 39.0–52.0)
Hemoglobin: 8 g/dL — ABNORMAL LOW (ref 13.0–17.0)
MCH: 32 pg (ref 26.0–34.0)
MCHC: 33.8 g/dL (ref 30.0–36.0)
MCV: 94.8 fL (ref 80.0–100.0)
Platelets: 206 K/uL (ref 150–400)
RBC: 2.5 MIL/uL — ABNORMAL LOW (ref 4.22–5.81)
RDW: 14.6 % (ref 11.5–15.5)
WBC: 9.6 K/uL (ref 4.0–10.5)
nRBC: 0 % (ref 0.0–0.2)

## 2024-05-17 MED ORDER — PROPOFOL 10 MG/ML IV BOLUS
INTRAVENOUS | Status: DC | PRN
Start: 2024-05-17 — End: 2024-05-17
  Administered 2024-05-17: 90 mg via INTRAVENOUS

## 2024-05-17 MED ORDER — SUCCINYLCHOLINE CHLORIDE 200 MG/10ML IV SOSY
PREFILLED_SYRINGE | INTRAVENOUS | Status: DC | PRN
Start: 2024-05-17 — End: 2024-05-17
  Administered 2024-05-17: 160 mg via INTRAVENOUS

## 2024-05-17 MED ORDER — CLONAZEPAM 1 MG PO TABS
2.0000 mg | ORAL_TABLET | Freq: Three times a day (TID) | ORAL | Status: DC
  Administered 2024-05-17 – 2024-06-01 (×45): 2 mg
  Filled 2024-05-17 (×6): qty 2
  Filled 2024-05-17: qty 4
  Filled 2024-05-17 (×7): qty 2
  Filled 2024-05-17: qty 4
  Filled 2024-05-17 (×30): qty 2

## 2024-05-17 MED ORDER — LACTATED RINGERS IV BOLUS
1000.0000 mL | Freq: Once | INTRAVENOUS | Status: AC
  Administered 2024-05-17: 1000 mL via INTRAVENOUS

## 2024-05-17 MED ORDER — CLONAZEPAM 1 MG PO TABS
2.0000 mg | ORAL_TABLET | Freq: Two times a day (BID) | ORAL | Status: DC
  Administered 2024-05-17: 2 mg
  Filled 2024-05-17: qty 2

## 2024-05-17 MED ORDER — OXYCODONE HCL 5 MG PO TABS
10.0000 mg | ORAL_TABLET | ORAL | Status: DC | PRN
  Administered 2024-05-17: 15 mg
  Filled 2024-05-17: qty 3

## 2024-05-17 MED ORDER — OLANZAPINE 10 MG PO TABS: 10.0000 mg | ORAL_TABLET | Freq: Two times a day (BID) | ORAL | Status: DC

## 2024-05-17 MED ORDER — OXYCODONE HCL 5 MG PO TABS
10.0000 mg | ORAL_TABLET | ORAL | Status: DC
  Administered 2024-05-17 – 2024-06-01 (×88): 10 mg
  Filled 2024-05-17 (×88): qty 2

## 2024-05-17 MED ORDER — FENTANYL BOLUS VIA INFUSION
25.0000 ug | INTRAVENOUS | Status: DC | PRN
  Administered 2024-05-18 (×2): 100 ug via INTRAVENOUS
  Administered 2024-05-18: 75 ug via INTRAVENOUS
  Administered 2024-05-18: 50 ug via INTRAVENOUS
  Administered 2024-05-18: 100 ug via INTRAVENOUS
  Administered 2024-05-19: 75 ug via INTRAVENOUS
  Administered 2024-05-19 – 2024-05-21 (×6): 100 ug via INTRAVENOUS
  Administered 2024-05-21: 50 ug via INTRAVENOUS
  Administered 2024-05-21: 100 ug via INTRAVENOUS
  Administered 2024-05-24 – 2024-05-25 (×2): 50 ug via INTRAVENOUS
  Administered 2024-05-26: 100 ug via INTRAVENOUS

## 2024-05-17 MED ORDER — ALBUTEROL SULFATE (2.5 MG/3ML) 0.083% IN NEBU: 2.5000 mg | INHALATION_SOLUTION | Freq: Four times a day (QID) | RESPIRATORY_TRACT | Status: DC | PRN

## 2024-05-17 MED ORDER — ORAL CARE MOUTH RINSE
15.0000 mL | OROMUCOSAL | Status: DC
  Administered 2024-05-18 (×6): 15 mL via OROMUCOSAL

## 2024-05-17 MED ORDER — FENTANYL CITRATE PF 50 MCG/ML IJ SOSY
25.0000 ug | PREFILLED_SYRINGE | Freq: Once | INTRAMUSCULAR | Status: DC
  Filled 2024-05-17: qty 1

## 2024-05-17 MED ORDER — FENTANYL 2500MCG IN NS 250ML (10MCG/ML) PREMIX INFUSION
0.0000 ug/h | INTRAVENOUS | Status: DC
  Administered 2024-05-17: 200 ug/h via INTRAVENOUS
  Administered 2024-05-18: 100 ug/h via INTRAVENOUS
  Administered 2024-05-19 – 2024-05-23 (×6): 150 ug/h via INTRAVENOUS
  Administered 2024-05-23 – 2024-05-25 (×3): 100 ug/h via INTRAVENOUS
  Filled 2024-05-17 (×11): qty 250

## 2024-05-17 MED ORDER — PROPOFOL 1000 MG/100ML IV EMUL
0.0000 ug/kg/min | INTRAVENOUS | Status: DC
  Administered 2024-05-17: 30 ug/kg/min via INTRAVENOUS
  Administered 2024-05-18: 40 ug/kg/min via INTRAVENOUS
  Administered 2024-05-18: 25 ug/kg/min via INTRAVENOUS
  Filled 2024-05-17 (×3): qty 100

## 2024-05-17 MED ORDER — MIDAZOLAM HCL 2 MG/2ML IJ SOLN
INTRAMUSCULAR | Status: AC
  Administered 2024-05-17: 2 mg via INTRAVENOUS
  Filled 2024-05-17: qty 2

## 2024-05-17 MED ORDER — ORAL CARE MOUTH RINSE: 15.0000 mL | OROMUCOSAL | Status: DC | PRN

## 2024-05-17 MED ORDER — OLANZAPINE 5 MG PO TABS
15.0000 mg | ORAL_TABLET | Freq: Two times a day (BID) | ORAL | Status: DC
  Administered 2024-05-17 – 2024-05-28 (×24): 15 mg
  Administered 2024-05-29: 10 mg
  Administered 2024-05-29 – 2024-06-03 (×10): 15 mg
  Filled 2024-05-17 (×36): qty 3

## 2024-05-17 MED ORDER — MIDAZOLAM HCL 2 MG/2ML IJ SOLN
1.0000 mg | INTRAMUSCULAR | Status: DC | PRN
  Administered 2024-05-18 – 2024-05-19 (×5): 1 mg via INTRAVENOUS
  Administered 2024-05-20: 2 mg via INTRAVENOUS
  Administered 2024-05-20 (×2): 1 mg via INTRAVENOUS
  Administered 2024-05-21: 2 mg via INTRAVENOUS
  Filled 2024-05-17 (×10): qty 2
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
## 2024-05-17 NOTE — Plan of Care (Signed)
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
## 2024-05-17 NOTE — Progress Notes (Signed)
 Called due to hypotension following versed administration, SBP in 60s. 1L LR ordered. Patient then pulled out recently placed NGT. Bedside RN replaced but patient with continued/worsening agitation and combativeness, desaturation to 70s with max flow on high flow [place]. Decision made to reintubate patient.   I evaluated patient at bedside. NRB placed and improvement of saturations to low 90s. Additional versed given after confirmation of normotension (SBP 130s). Anesthesia at bedside and reintubated with 6.5 ETT. Bilateral breath sounds, appropriate CO2 color change. Please see their documentation. Improvement of saturations post intubation.  Propofol  and fentanyl  gtts ordered.  Critical Care Time: 30 minutes  Orie Silversmith, MD [facility]
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
## 2024-05-17 NOTE — Progress Notes (Signed)
 Trauma Event Note   TRN rounding on patient. On assessment, patient increasingly altered, agitated and combative. Patient SpO2 at best 84% maxed on HFNC. Dr. Ann made aware, decision made to intubate patient. Anesthesia called for intubation. Dr. Ann to bedside. Patient successfully intubated by anesthesia. Orders updated by Dr. Ann.  MD notified at 2037.  Last imported Vital Signs BP (!) 172/154   Pulse 68   Temp 98.6 F (37 C)   Resp (!) 31   Ht 5' 9 (1.753 m)   Wt 178 lb 9.2 oz (81 kg)   SpO2 (!) 88%   BMI 26.37 kg/m   Trending CBC Recent Labs    05/15/24 0904 05/16/24 0605 05/17/24 0703  WBC 13.6* 8.8 9.6  HGB 9.4* 8.4* 8.0*  HCT 28.4* 24.9* 23.7*  PLT 167 167 206    Trending Coag's No results for input(s): APTT, INR in the last 72 hours.  Trending BMET Recent Labs    05/15/24 0632 05/16/24 0605 05/17/24 0703  NA 138 143 142  K 4.0 3.7 3.7  CL 106 110 106  CO2 23 25 28   BUN 10 11 18   CREATININE 0.71 0.70 0.86  GLUCOSE 171* 137* 152*      Lucas [name]  Trauma Response RN  Please call TRN at [phone] for further assistance.

## 2024-05-17 NOTE — Progress Notes (Signed)
 Inpatient Rehab Admissions Coordinator Note:   Per therapy patient was screened for CIR candidacy by Keavon Sensing SHAUNNA Yvone Cohens, CCC-SLP. At this time, pt has yet to attempt transfers d/t agitation and the need to be medicated. CIR admissions team will follow to monitor for progress and participation with therapies. A consult order will be placed if pt appears to be an appropriate candidate.   Tinnie Yvone Cohens, MS, CCC-SLP Admissions Coordinator [phone] 05/17/24 3:26 PM
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
## 2024-05-17 NOTE — Anesthesia Procedure Notes (Signed)
 Procedure Name: Intubation Date/Time: 05/17/2024 8:55 PM  Performed by: Leopoldo Bruckner, MDPre-anesthesia Checklist: Patient identified, Emergency Drugs available, Suction available and Patient being monitored Patient Re-evaluated:Patient Re-evaluated prior to induction Oxygen Delivery Method: Ambu bag Preoxygenation: Pre-oxygenation with 100% oxygen Induction Type: IV induction Laryngoscope Size: Glidescope Grade View: Grade I Tube type: Oral Tube size: 6.5 mm Number of attempts: 1 Airway Equipment and Method: Stylet and Oral airway Placement Confirmation: ETT inserted through vocal cords under direct vision, positive ETCO2 and breath sounds checked- equal and bilateral Secured at: 24 cm Tube secured with: Tape Dental Injury: Teeth and Oropharynx as per pre-operative assessment

## 2024-05-18 LAB — CBC
HCT: 22.5 % — ABNORMAL LOW (ref 39.0–52.0)
HCT: 24.6 % — ABNORMAL LOW (ref 39.0–52.0)
Hemoglobin: 7.5 g/dL — ABNORMAL LOW (ref 13.0–17.0)
Hemoglobin: 8 g/dL — ABNORMAL LOW (ref 13.0–17.0)
MCH: 31.8 pg (ref 26.0–34.0)
MCH: 31.4 pg (ref 26.0–34.0)
MCHC: 33.3 g/dL (ref 30.0–36.0)
MCHC: 32.5 g/dL (ref 30.0–36.0)
MCV: 95.3 fL (ref 80.0–100.0)
MCV: 96.5 fL (ref 80.0–100.0)
Platelets: 200 K/uL (ref 150–400)
Platelets: 181 K/uL (ref 150–400)
RBC: 2.36 MIL/uL — ABNORMAL LOW (ref 4.22–5.81)
RBC: 2.55 MIL/uL — ABNORMAL LOW (ref 4.22–5.81)
RDW: 14.6 % (ref 11.5–15.5)
RDW: 14.7 % (ref 11.5–15.5)
WBC: 9.2 K/uL (ref 4.0–10.5)
WBC: 9.2 K/uL (ref 4.0–10.5)
nRBC: 0 % (ref 0.0–0.2)
nRBC: 0.7 % — ABNORMAL HIGH (ref 0.0–0.2)

## 2024-05-18 LAB — BASIC METABOLIC PANEL WITH GFR
Anion gap: 10 (ref 5–15)
BUN: 18 mg/dL (ref 6–20)
CO2: 26 mmol/L (ref 22–32)
Calcium: 8.8 mg/dL — ABNORMAL LOW (ref 8.9–10.3)
Chloride: 106 mmol/L (ref 98–111)
Creatinine, Ser: 0.88 mg/dL (ref 0.61–1.24)
GFR, Estimated: >60 mL/min (ref >60)
Glucose, Bld: 110 mg/dL — ABNORMAL HIGH (ref 70–99)
Potassium: 3.6 mmol/L (ref 3.5–5.1)
Sodium: 142 mmol/L (ref 135–145)

## 2024-05-18 LAB — GLUCOSE, CAPILLARY
Glucose-Capillary: 121 mg/dL — ABNORMAL HIGH (ref 70–99)
Glucose-Capillary: 154 mg/dL — ABNORMAL HIGH (ref 70–99)
Glucose-Capillary: 136 mg/dL — ABNORMAL HIGH (ref 70–99)
Glucose-Capillary: 127 mg/dL — ABNORMAL HIGH (ref 70–99)
Glucose-Capillary: 127 mg/dL — ABNORMAL HIGH (ref 70–99)

## 2024-05-18 LAB — TRIGLYCERIDES: Triglycerides: 83 mg/dL (ref <150)

## 2024-05-18 MED ORDER — ORAL CARE MOUTH RINSE
15.0000 mL | OROMUCOSAL | Status: DC
  Administered 2024-05-18 – 2024-05-26 (×96): 15 mL via OROMUCOSAL

## 2024-05-18 MED ORDER — ORAL CARE MOUTH RINSE: 15.0000 mL | OROMUCOSAL | Status: DC | PRN

## 2024-05-18 MED ORDER — OXYCODONE HCL 5 MG PO TABS
5.0000 mg | ORAL_TABLET | ORAL | Status: DC | PRN
  Administered 2024-05-18 – 2024-06-02 (×16): 10 mg
  Administered 2024-06-02: 5 mg
  Administered 2024-06-03 – 2024-06-06 (×4): 10 mg
  Filled 2024-05-18 (×22): qty 2

## 2024-05-18 MED ORDER — CALCIUM GLUCONATE-NACL 2-0.675 GM/100ML-% IV SOLN
2.0000 g | Freq: Once | INTRAVENOUS | Status: AC
  Administered 2024-05-18: 2000 mg via INTRAVENOUS
  Filled 2024-05-18: qty 100

## 2024-05-18 MED ORDER — VALPROIC ACID 250 MG/5ML PO SOLN
500.0000 mg | Freq: Two times a day (BID) | ORAL | Status: DC
  Administered 2024-05-18 – 2024-05-19 (×4): 500 mg
  Filled 2024-05-18 (×4): qty 10

## 2024-05-18 MED ORDER — LEVETIRACETAM 100 MG/ML PO SOLN
500.0000 mg | Freq: Two times a day (BID) | ORAL | Status: AC
  Administered 2024-05-18 – 2024-05-20 (×6): 500 mg
  Filled 2024-05-18 (×6): qty 5

## 2024-05-18 MED ORDER — LACTATED RINGERS IV SOLN: INTRAVENOUS | Status: AC

## 2024-05-18 MED ORDER — ALBUMIN HUMAN 25 % IV SOLN
12.5000 g | Freq: Once | INTRAVENOUS | Status: AC
  Administered 2024-05-18: 12.5 g via INTRAVENOUS
  Filled 2024-05-18: qty 50

## 2024-05-18 MED ORDER — DEXMEDETOMIDINE HCL IN NACL 400 MCG/100ML IV SOLN
0.0000 ug/kg/h | INTRAVENOUS | Status: DC
  Administered 2024-05-18: 0.5 ug/kg/h via INTRAVENOUS
  Administered 2024-05-18: 0.4 ug/kg/h via INTRAVENOUS
  Administered 2024-05-19: 0.8 ug/kg/h via INTRAVENOUS
  Administered 2024-05-19: 0.6 ug/kg/h via INTRAVENOUS
  Administered 2024-05-19 (×2): 1 ug/kg/h via INTRAVENOUS
  Administered 2024-05-20: 0.9 ug/kg/h via INTRAVENOUS
  Administered 2024-05-20 (×3): 0.8 ug/kg/h via INTRAVENOUS
  Administered 2024-05-21: 0.7 ug/kg/h via INTRAVENOUS
  Administered 2024-05-21 – 2024-05-23 (×6): 0.8 ug/kg/h via INTRAVENOUS
  Administered 2024-05-23: 1.2 ug/kg/h via INTRAVENOUS
  Administered 2024-05-23: 0.8 ug/kg/h via INTRAVENOUS
  Administered 2024-05-23 – 2024-05-24 (×4): 1.2 ug/kg/h via INTRAVENOUS
  Administered 2024-05-24: 1 ug/kg/h via INTRAVENOUS
  Administered 2024-05-24 – 2024-05-25 (×7): 1.2 ug/kg/h via INTRAVENOUS
  Administered 2024-05-26: 1 ug/kg/h via INTRAVENOUS
  Administered 2024-05-26: 0.9 ug/kg/h via INTRAVENOUS
  Administered 2024-05-26: 1.2 ug/kg/h via INTRAVENOUS
  Administered 2024-05-26: 1.1 ug/kg/h via INTRAVENOUS
  Administered 2024-05-26 – 2024-05-27 (×3): 1 ug/kg/h via INTRAVENOUS
  Administered 2024-05-27: 0.9 ug/kg/h via INTRAVENOUS
  Administered 2024-05-27 – 2024-05-28 (×2): 1 ug/kg/h via INTRAVENOUS
  Administered 2024-05-28: 0.9 ug/kg/h via INTRAVENOUS
  Administered 2024-05-28: 0.8 ug/kg/h via INTRAVENOUS
  Administered 2024-05-28: 1 ug/kg/h via INTRAVENOUS
  Administered 2024-05-29: 0.9 ug/kg/h via INTRAVENOUS
  Administered 2024-05-29: 1 ug/kg/h via INTRAVENOUS
  Administered 2024-05-30: 0.8 ug/kg/h via INTRAVENOUS
  Administered 2024-05-30 – 2024-05-31 (×2): 0.7 ug/kg/h via INTRAVENOUS
  Administered 2024-05-31: 1 ug/kg/h via INTRAVENOUS
  Administered 2024-05-31: 1.1 ug/kg/h via INTRAVENOUS
  Administered 2024-06-01: 0.6 ug/kg/h via INTRAVENOUS
  Administered 2024-06-01: 0.8 ug/kg/h via INTRAVENOUS
  Filled 2024-05-18 (×7): qty 100
  Filled 2024-05-18: qty 200
  Filled 2024-05-18 (×10): qty 100
  Filled 2024-05-18: qty 300
  Filled 2024-05-18 (×41): qty 100

## 2024-05-18 MED ORDER — BETHANECHOL CHLORIDE 25 MG PO TABS
50.0000 mg | ORAL_TABLET | Freq: Three times a day (TID) | ORAL | Status: DC
  Administered 2024-05-18 – 2024-05-31 (×39): 50 mg
  Filled 2024-05-18 (×39): qty 2

## 2024-05-18 MED ORDER — QUETIAPINE FUMARATE 100 MG PO TABS
100.0000 mg | ORAL_TABLET | Freq: Two times a day (BID) | ORAL | Status: DC
  Administered 2024-05-18: 100 mg
  Filled 2024-05-18: qty 1

## 2024-05-18 MED ORDER — LACTATED RINGERS IV BOLUS
1000.0000 mL | Freq: Once | INTRAVENOUS | Status: AC
  Administered 2024-05-18: 1000 mL via INTRAVENOUS

## 2024-05-18 NOTE — Procedures (Signed)
 Cortrak  Person Inserting Tube:  Kreig Parson, Olivia SAUNDERS, RD Tube Type:  Cortrak - 43 inches Tube Size:  10 Tube Location:  Right nare Initial Placement:  Stomach Secured by: Bridle Technique Used to Measure Tube Placement:  Marking at nare/corner of mouth Cortrak Secured At:  70 cm   Cortrak Tube Team Note:  Consult received to place a Cortrak feeding tube.   No x-ray is required. RN may begin using tube.   If the tube becomes dislodged please keep the tube and contact the Cortrak team at [email or web address] for replacement.  If after hours and replacement cannot be delayed, place a NG tube and confirm placement with an abdominal x-ray.    Olivia Kenning, RD Registered Dietitian  See Amion for more information
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
## 2024-05-18 NOTE — Progress Notes (Addendum)
 SLP Cancellation Note  Patient Details Name: GEARALD STONEBRAKER MRN: [id number] DOB: 1981-03-29   Cancelled treatment:       Reason Eval/Treat Not Completed: Patient not medically ready (reintubated last night). Will f/u as able.   Leita SAILOR., M.A. CCC-SLP Acute Rehabilitation Services Office: [phone]  Secure chat preferred  05/18/2024, 7:55 AM

## 2024-05-19 LAB — BASIC METABOLIC PANEL WITH GFR
Anion gap: 10 (ref 5–15)
BUN: 15 mg/dL (ref 6–20)
CO2: 27 mmol/L (ref 22–32)
Calcium: 8.3 mg/dL — ABNORMAL LOW (ref 8.9–10.3)
Chloride: 108 mmol/L (ref 98–111)
Creatinine, Ser: 0.75 mg/dL (ref 0.61–1.24)
GFR, Estimated: >60 mL/min (ref >60)
Glucose, Bld: 124 mg/dL — ABNORMAL HIGH (ref 70–99)
Potassium: 3.5 mmol/L (ref 3.5–5.1)
Sodium: 145 mmol/L (ref 135–145)

## 2024-05-19 LAB — CBC
HCT: 25.2 % — ABNORMAL LOW (ref 39.0–52.0)
Hemoglobin: 8.2 g/dL — ABNORMAL LOW (ref 13.0–17.0)
MCH: 31.1 pg (ref 26.0–34.0)
MCHC: 32.5 g/dL (ref 30.0–36.0)
MCV: 95.5 fL (ref 80.0–100.0)
Platelets: 265 K/uL (ref 150–400)
RBC: 2.64 MIL/uL — ABNORMAL LOW (ref 4.22–5.81)
RDW: 14.6 % (ref 11.5–15.5)
WBC: 10.4 K/uL (ref 4.0–10.5)
nRBC: 1.4 % — ABNORMAL HIGH (ref 0.0–0.2)

## 2024-05-19 LAB — GLUCOSE, CAPILLARY
Glucose-Capillary: 107 mg/dL — ABNORMAL HIGH (ref 70–99)
Glucose-Capillary: 133 mg/dL — ABNORMAL HIGH (ref 70–99)
Glucose-Capillary: 129 mg/dL — ABNORMAL HIGH (ref 70–99)
Glucose-Capillary: 116 mg/dL — ABNORMAL HIGH (ref 70–99)
Glucose-Capillary: 151 mg/dL — ABNORMAL HIGH (ref 70–99)
Glucose-Capillary: 132 mg/dL — ABNORMAL HIGH (ref 70–99)

## 2024-05-19 MED ORDER — MAGNESIUM CITRATE PO SOLN
0.5000 | Freq: Once | ORAL | Status: AC
  Administered 2024-05-19: 0.5
  Filled 2024-05-19: qty 296

## 2024-05-19 MED ORDER — FREE WATER
150.0000 mL | Status: DC
  Administered 2024-05-19 – 2024-06-04 (×92): 150 mL

## 2024-05-19 MED ORDER — ENOXAPARIN SODIUM 30 MG/0.3ML IJ SOSY
30.0000 mg | PREFILLED_SYRINGE | Freq: Two times a day (BID) | INTRAMUSCULAR | Status: DC
  Administered 2024-05-19 – 2024-06-08 (×40): 30 mg via SUBCUTANEOUS
  Filled 2024-05-19 (×40): qty 0.3

## 2024-05-19 NOTE — Progress Notes (Signed)
 SLP Cancellation Note  Patient Details Name: Lucas [name] MRN: [id number] DOB: [birth year]   Cancelled treatment:       Reason Eval/Treat Not Completed: Patient not medically ready. Unable to complete SLE at this time, as pt continues to be intubated. Will continue efforts.  Fadi Menter B. Dory, MSP, CCC-SLP Speech Language Pathologist Office: [phone]  Dory Caprice Daring 05/19/2024, 10:35 AM
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
## 2024-05-19 NOTE — Progress Notes (Signed)
 OT Cancellation Note  Patient Details Name: Lucas [name] MRN: [id number] DOB: 12-14-1980   Cancelled Treatment:    Reason Eval/Treat Not Completed: Medical issues which prohibited therapy (intubated, remains too sedated at this time to participate in therapy. Will follow up for OT tx as appropriate.)  Lucas [name], OTD, OTR/L SecureChat Preferred Acute Rehab [phone]   Lucas [name] 05/19/2024, 8:48 AM

## 2024-05-19 NOTE — Plan of Care (Signed)

## 2024-05-19 NOTE — Progress Notes (Signed)
 PT Cancellation Note  Patient Details Name: ROBBIN ESCHER MRN: [id number] DOB: 1981/05/29   Cancelled Treatment:    Reason Eval/Treat Not Completed: Medical issues which prohibited therapy  Per discussion with RN, pt too sedated to work with PT. He also easily gets very agitated if sedation reduced. Will follow for now.    Macario RAMAN, PT Acute Rehabilitation Services  Office [phone]  Macario SHAUNNA Soja 05/19/2024, 8:17 AM

## 2024-05-19 NOTE — Progress Notes (Signed)
 NEUROSURGERY PROGRESS NOTE  Still intubated and sedated. Arouses easily to noxious stimulation. MAE well. No new neurosurgery recommendations  Temp:  [98.9 F (37.2 C)-99.7 F (37.6 C)] 99.7 F (37.6 C) (07/01 0400) Pulse Rate:  [57-107] 86 (07/01 0615) Resp:  [0-29] 13 (07/01 0615) BP: (91-143)/(60-99) 113/69 (07/01 0615) SpO2:  [85 %-99 %] 89 % (07/01 0615) FiO2 (%):  [40 %-50 %] 50 % (07/01 0320) Weight:  [83.2 kg] 83.2 kg (07/01 0500)    Lucas Chiquita Pean, NP 05/19/2024 7:48 AM
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
## 2024-05-20 ENCOUNTER — Inpatient Hospital Stay (HOSPITAL_COMMUNITY)

## 2024-05-20 DIAGNOSIS — E440 Moderate protein-calorie malnutrition: Secondary | ICD-10-CM | POA: Insufficient documentation

## 2024-05-20 LAB — BASIC METABOLIC PANEL WITH GFR
Anion gap: 9 (ref 5–15)
BUN: 15 mg/dL (ref 6–20)
CO2: 28 mmol/L (ref 22–32)
Calcium: 8.5 mg/dL — ABNORMAL LOW (ref 8.9–10.3)
Chloride: 105 mmol/L (ref 98–111)
Creatinine, Ser: 0.63 mg/dL (ref 0.61–1.24)
GFR, Estimated: >60 mL/min (ref >60)
Glucose, Bld: 120 mg/dL — ABNORMAL HIGH (ref 70–99)
Potassium: 3.4 mmol/L — ABNORMAL LOW (ref 3.5–5.1)
Sodium: 142 mmol/L (ref 135–145)

## 2024-05-20 LAB — GLUCOSE, CAPILLARY
Glucose-Capillary: 96 mg/dL (ref 70–99)
Glucose-Capillary: 111 mg/dL — ABNORMAL HIGH (ref 70–99)
Glucose-Capillary: 138 mg/dL — ABNORMAL HIGH (ref 70–99)
Glucose-Capillary: 104 mg/dL — ABNORMAL HIGH (ref 70–99)
Glucose-Capillary: 140 mg/dL — ABNORMAL HIGH (ref 70–99)
Glucose-Capillary: 116 mg/dL — ABNORMAL HIGH (ref 70–99)

## 2024-05-20 LAB — CBC
HCT: 25.9 % — ABNORMAL LOW (ref 39.0–52.0)
Hemoglobin: 8.7 g/dL — ABNORMAL LOW (ref 13.0–17.0)
MCH: 31.6 pg (ref 26.0–34.0)
MCHC: 33.6 g/dL (ref 30.0–36.0)
MCV: 94.2 fL (ref 80.0–100.0)
Platelets: 339 K/uL (ref 150–400)
RBC: 2.75 MIL/uL — ABNORMAL LOW (ref 4.22–5.81)
RDW: 14.6 % (ref 11.5–15.5)
WBC: 10.5 K/uL (ref 4.0–10.5)
nRBC: 1.2 % — ABNORMAL HIGH (ref 0.0–0.2)

## 2024-05-20 MED ORDER — MIDAZOLAM HCL 2 MG/2ML IJ SOLN
2.0000 mg | Freq: Once | INTRAMUSCULAR | Status: AC
  Administered 2024-05-20: 2 mg via INTRAVENOUS
  Filled 2024-05-20: qty 2

## 2024-05-20 MED ORDER — VECURONIUM BROMIDE 10 MG IV SOLR
10.0000 mg | Freq: Once | INTRAVENOUS | Status: AC
  Administered 2024-05-20: 10 mg via INTRAVENOUS
  Filled 2024-05-20: qty 10

## 2024-05-20 MED ORDER — VALPROIC ACID 250 MG/5ML PO SOLN
1000.0000 mg | Freq: Two times a day (BID) | ORAL | Status: DC
  Administered 2024-05-20 – 2024-05-22 (×6): 1000 mg
  Filled 2024-05-20 (×6): qty 20

## 2024-05-20 MED ORDER — POTASSIUM CHLORIDE 20 MEQ PO PACK
40.0000 meq | PACK | Freq: Once | ORAL | Status: AC
  Administered 2024-05-20: 40 meq
  Filled 2024-05-20: qty 2

## 2024-05-20 MED ORDER — MAGNESIUM CITRATE PO SOLN
1.0000 | Freq: Once | ORAL | Status: AC
  Administered 2024-05-20: 1
  Filled 2024-05-20: qty 296

## 2024-05-20 NOTE — Progress Notes (Signed)
 OT Cancellation Note  Patient Details Name: Lucas [name] MRN: [id number] DOB: Dec 24, 1980   Cancelled Treatment:    Reason Eval/Treat Not Completed: Medical issues which prohibited therapy (Remains intubated and sedated with RASS -3. Will continue to follow)  Lativia Velie K, OTD, OTR/L SecureChat Preferred Acute Rehab [phone]   Lucas [name] 05/20/2024, 7:36 AM
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
## 2024-05-20 NOTE — Progress Notes (Signed)
 Patient attempted to wean 5/5 40%. Patient initially ok but within a few minutes, patient began getting excessively agitated. Patient flipped back to full vent support within 15 minutes. RN at bedside throughout.

## 2024-05-20 NOTE — Progress Notes (Signed)
 PT Cancellation Note  Patient Details Name: Lucas [name] MRN: [id number] DOB: Apr 30, 1981   Cancelled Treatment:    Reason Eval/Treat Not Completed: Medical issues which prohibited therapy  Remains intubated and sedated with RASS -3. Will continue to follow.    Macario RAMAN, PT Acute Rehabilitation Services  Office [phone]   Macario SHAUNNA Soja 05/20/2024, 7:33 AM
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
## 2024-05-21 ENCOUNTER — Inpatient Hospital Stay (HOSPITAL_COMMUNITY)

## 2024-05-21 LAB — BASIC METABOLIC PANEL WITH GFR
Anion gap: 11 (ref 5–15)
BUN: 16 mg/dL (ref 6–20)
CO2: 29 mmol/L (ref 22–32)
Calcium: 8.4 mg/dL — ABNORMAL LOW (ref 8.9–10.3)
Chloride: 102 mmol/L (ref 98–111)
Creatinine, Ser: 0.64 mg/dL (ref 0.61–1.24)
GFR, Estimated: >60 mL/min (ref >60)
Glucose, Bld: 158 mg/dL — ABNORMAL HIGH (ref 70–99)
Potassium: 3.4 mmol/L — ABNORMAL LOW (ref 3.5–5.1)
Sodium: 142 mmol/L (ref 135–145)

## 2024-05-21 LAB — CBC
HCT: 25 % — ABNORMAL LOW (ref 39.0–52.0)
Hemoglobin: 8.1 g/dL — ABNORMAL LOW (ref 13.0–17.0)
MCH: 31.3 pg (ref 26.0–34.0)
MCHC: 32.4 g/dL (ref 30.0–36.0)
MCV: 96.5 fL (ref 80.0–100.0)
Platelets: 367 K/uL (ref 150–400)
RBC: 2.59 MIL/uL — ABNORMAL LOW (ref 4.22–5.81)
RDW: 14.6 % (ref 11.5–15.5)
WBC: 11.3 K/uL — ABNORMAL HIGH (ref 4.0–10.5)
nRBC: 0.5 % — ABNORMAL HIGH (ref 0.0–0.2)

## 2024-05-21 LAB — GLUCOSE, CAPILLARY
Glucose-Capillary: 88 mg/dL (ref 70–99)
Glucose-Capillary: 111 mg/dL — ABNORMAL HIGH (ref 70–99)
Glucose-Capillary: 129 mg/dL — ABNORMAL HIGH (ref 70–99)
Glucose-Capillary: 112 mg/dL — ABNORMAL HIGH (ref 70–99)
Glucose-Capillary: 122 mg/dL — ABNORMAL HIGH (ref 70–99)
Glucose-Capillary: 106 mg/dL — ABNORMAL HIGH (ref 70–99)

## 2024-05-21 LAB — MAGNESIUM: Magnesium: 2.2 mg/dL (ref 1.7–2.4)

## 2024-05-21 MED ORDER — MIDAZOLAM HCL 2 MG/2ML IJ SOLN
2.0000 mg | INTRAMUSCULAR | Status: DC | PRN
  Administered 2024-05-21 – 2024-05-25 (×6): 2 mg via INTRAVENOUS
  Filled 2024-05-21 (×7): qty 2

## 2024-05-21 MED ORDER — SENNA 8.6 MG PO TABS
2.0000 | ORAL_TABLET | Freq: Every day | ORAL | Status: DC
  Administered 2024-05-21 – 2024-06-02 (×12): 17.2 mg
  Filled 2024-05-21 (×13): qty 2

## 2024-05-21 MED ORDER — POTASSIUM CHLORIDE 20 MEQ PO PACK
40.0000 meq | PACK | Freq: Once | ORAL | Status: AC
  Administered 2024-05-21: 40 meq
  Filled 2024-05-21: qty 2

## 2024-05-21 MED ORDER — SODIUM CHLORIDE 0.9 % IV SOLN
2.0000 g | Freq: Three times a day (TID) | INTRAVENOUS | Status: AC
  Administered 2024-05-21 – 2024-05-27 (×20): 2 g via INTRAVENOUS
  Filled 2024-05-21 (×20): qty 12.5

## 2024-05-21 NOTE — Progress Notes (Signed)
 SLP Cancellation/Discharge Note  Patient Details Name: JACHOB MCCLEAN MRN: [id number] DOB: Nov 15, 1981   Cancelled treatment:       Reason Eval/Treat Not Completed: Patient not medically ready- pt remains intubated/sedated. Will sign off. Please reorder when appropriate for SLP intervention.  Magdiel Bartles L. Vona, MA CCC/SLP Clinical Specialist - Acute Care SLP Acute Rehabilitation Services Office number [phone]    Vona Palma Laurice 05/21/2024, 1:24 PM

## 2024-05-21 NOTE — Progress Notes (Signed)
 OT Cancellation Note  Patient Details Name: Lucas [name] MRN: [id number] DOB: [birth year]   Cancelled Treatment:    Reason Eval/Treat Not Completed: Medical issues which prohibited therapy.  Remains intubated and sedated.  Will sign off and await new orders when appropriate.    Khai Arrona D Lashona Schaaf 05/21/2024, 8:52 AM 05/21/2024  RP, OTR/L  Acute Rehabilitation Services  Office:  [phone]

## 2024-05-21 NOTE — Plan of Care (Signed)
 Sedation weaned, spontaneous breathing trail for approx. 2hrs. Foley catheter replaced for retention. ABX initiated for prophylactic pneumonia

## 2024-05-21 NOTE — Progress Notes (Signed)
 PT Cancellation/Discharge Note  Patient Details Name: Lucas [name] MRN: [id number] DOB: 07-30-1981   Cancelled Treatment:    Reason Eval/Treat Not Completed: Medical issues which prohibited therapy  Patient remains intubated/sedated. Will sign-off at this time. Please reorder when pt appropriate for PT intervention.    Macario RAMAN, PT Acute Rehabilitation Services  Office [phone]  Macario SHAUNNA Soja 05/21/2024, 7:59 AM
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
## 2024-05-22 LAB — CBC
HCT: 25.9 % — ABNORMAL LOW (ref 39.0–52.0)
Hemoglobin: 8.3 g/dL — ABNORMAL LOW (ref 13.0–17.0)
MCH: 31.1 pg (ref 26.0–34.0)
MCHC: 32 g/dL (ref 30.0–36.0)
MCV: 97 fL (ref 80.0–100.0)
Platelets: 418 K/uL — ABNORMAL HIGH (ref 150–400)
RBC: 2.67 MIL/uL — ABNORMAL LOW (ref 4.22–5.81)
RDW: 14.6 % (ref 11.5–15.5)
WBC: 9.8 K/uL (ref 4.0–10.5)
nRBC: 0.2 % (ref 0.0–0.2)

## 2024-05-22 LAB — CULTURE, RESPIRATORY W GRAM STAIN

## 2024-05-22 LAB — GLUCOSE, CAPILLARY
Glucose-Capillary: 111 mg/dL — ABNORMAL HIGH (ref 70–99)
Glucose-Capillary: 116 mg/dL — ABNORMAL HIGH (ref 70–99)
Glucose-Capillary: 122 mg/dL — ABNORMAL HIGH (ref 70–99)
Glucose-Capillary: 101 mg/dL — ABNORMAL HIGH (ref 70–99)
Glucose-Capillary: 134 mg/dL — ABNORMAL HIGH (ref 70–99)
Glucose-Capillary: 131 mg/dL — ABNORMAL HIGH (ref 70–99)

## 2024-05-22 LAB — BASIC METABOLIC PANEL WITH GFR
Anion gap: 6 (ref 5–15)
BUN: 16 mg/dL (ref 6–20)
CO2: 30 mmol/L (ref 22–32)
Calcium: 8.4 mg/dL — ABNORMAL LOW (ref 8.9–10.3)
Chloride: 106 mmol/L (ref 98–111)
Creatinine, Ser: 0.75 mg/dL (ref 0.61–1.24)
GFR, Estimated: >60 mL/min (ref >60)
Glucose, Bld: 165 mg/dL — ABNORMAL HIGH (ref 70–99)
Potassium: 3.9 mmol/L (ref 3.5–5.1)
Sodium: 142 mmol/L (ref 135–145)

## 2024-05-22 NOTE — Progress Notes (Signed)
 Patient very agitated with lower sedation.  Trauma MD called to bedside.  Unable to safely extubate.  Sedation back up and ventilator put back on full support.  RT notified.

## 2024-05-22 NOTE — TOC Progression Note (Signed)
 Transition of Care [facility]) - Progression Note    Patient Details  Name: Lucas [name] MRN: [id number] Date of Birth: [birth year]  Transition of Care [facility]) CM/SW Contact  Carron Mcmurry E Sumner Boesch, LCSW Phone Number: 05/22/2024, 8:39 AM  Clinical Narrative:    TOC continues to follow. Plan for PT, OT, and Psych evals when medically appropriate.   Expected Discharge Plan: IP Rehab Facility Barriers to Discharge: Continued Medical Work up  Expected Discharge Plan and Services                                               Social Determinants of Health (SDOH) Interventions SDOH Screenings   Tobacco Use: High Risk (05/17/2024)    Readmission Risk Interventions     No data to display
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
## 2024-05-22 NOTE — Progress Notes (Signed)
 Patient ID: Lucas [name], male   DOB: January 24, 1981, 43 y.o.   MRN: [id number] Vital signs are stable.  Patient remains agitated when sedation is lightened.  He is slowly weaning from the ventilator though.  No changes, continue supportive care

## 2024-05-23 LAB — GLUCOSE, CAPILLARY
Glucose-Capillary: 125 mg/dL — ABNORMAL HIGH (ref 70–99)
Glucose-Capillary: 113 mg/dL — ABNORMAL HIGH (ref 70–99)
Glucose-Capillary: 121 mg/dL — ABNORMAL HIGH (ref 70–99)
Glucose-Capillary: 104 mg/dL — ABNORMAL HIGH (ref 70–99)
Glucose-Capillary: 106 mg/dL — ABNORMAL HIGH (ref 70–99)
Glucose-Capillary: 118 mg/dL — ABNORMAL HIGH (ref 70–99)

## 2024-05-23 MED ORDER — VECURONIUM BROMIDE 10 MG IV SOLR
10.0000 mg | Freq: Once | INTRAVENOUS | Status: AC
  Administered 2024-05-23: 10 mg via INTRAVENOUS

## 2024-05-23 MED ORDER — VALPROIC ACID 250 MG/5ML PO SOLN
1500.0000 mg | Freq: Two times a day (BID) | ORAL | Status: DC
  Administered 2024-05-23 – 2024-06-01 (×19): 1500 mg
  Filled 2024-05-23 (×32): qty 30

## 2024-05-23 MED ORDER — VECURONIUM BROMIDE 10 MG IV SOLR
INTRAVENOUS | Status: AC
  Filled 2024-05-23: qty 10

## 2024-05-23 NOTE — Progress Notes (Signed)
 This nurse checked on patient at 0120 due to vent alarming. Pt was severely agitated and covered with large amount of tan-colored vomit on his beard and upper body. ETT suction performed, removing copious secretions from his airway. Tube feeding was stopped. 2 mg versed  and 4 mg zofran  given IV. Damien TRN notified.
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
## 2024-05-24 LAB — BASIC METABOLIC PANEL WITH GFR
Anion gap: 8 (ref 5–15)
BUN: 13 mg/dL (ref 6–20)
CO2: 30 mmol/L (ref 22–32)
Calcium: 8.8 mg/dL — ABNORMAL LOW (ref 8.9–10.3)
Chloride: 103 mmol/L (ref 98–111)
Creatinine, Ser: 0.74 mg/dL (ref 0.61–1.24)
GFR, Estimated: >60 mL/min (ref >60)
Glucose, Bld: 117 mg/dL — ABNORMAL HIGH (ref 70–99)
Potassium: 4.1 mmol/L (ref 3.5–5.1)
Sodium: 141 mmol/L (ref 135–145)

## 2024-05-24 LAB — CBC
HCT: 26.5 % — ABNORMAL LOW (ref 39.0–52.0)
Hemoglobin: 8.9 g/dL — ABNORMAL LOW (ref 13.0–17.0)
MCH: 32 pg (ref 26.0–34.0)
MCHC: 33.6 g/dL (ref 30.0–36.0)
MCV: 95.3 fL (ref 80.0–100.0)
Platelets: 543 K/uL — ABNORMAL HIGH (ref 150–400)
RBC: 2.78 MIL/uL — ABNORMAL LOW (ref 4.22–5.81)
RDW: 14 % (ref 11.5–15.5)
WBC: 10.4 K/uL (ref 4.0–10.5)
nRBC: 0.2 % (ref 0.0–0.2)

## 2024-05-24 LAB — GLUCOSE, CAPILLARY
Glucose-Capillary: 110 mg/dL — ABNORMAL HIGH (ref 70–99)
Glucose-Capillary: 107 mg/dL — ABNORMAL HIGH (ref 70–99)
Glucose-Capillary: 110 mg/dL — ABNORMAL HIGH (ref 70–99)
Glucose-Capillary: 114 mg/dL — ABNORMAL HIGH (ref 70–99)
Glucose-Capillary: 110 mg/dL — ABNORMAL HIGH (ref 70–99)
Glucose-Capillary: 134 mg/dL — ABNORMAL HIGH (ref 70–99)

## 2024-05-24 MED ORDER — FUROSEMIDE 10 MG/ML IJ SOLN
40.0000 mg | Freq: Two times a day (BID) | INTRAMUSCULAR | Status: AC
  Administered 2024-05-24 (×2): 40 mg via INTRAVENOUS
  Filled 2024-05-24 (×2): qty 4
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
## 2024-05-25 LAB — GLUCOSE, CAPILLARY
Glucose-Capillary: 115 mg/dL — ABNORMAL HIGH (ref 70–99)
Glucose-Capillary: 92 mg/dL (ref 70–99)
Glucose-Capillary: 135 mg/dL — ABNORMAL HIGH (ref 70–99)
Glucose-Capillary: 129 mg/dL — ABNORMAL HIGH (ref 70–99)
Glucose-Capillary: 93 mg/dL (ref 70–99)
Glucose-Capillary: 135 mg/dL — ABNORMAL HIGH (ref 70–99)
Glucose-Capillary: 115 mg/dL — ABNORMAL HIGH (ref 70–99)

## 2024-05-25 LAB — CBC
HCT: 30.8 % — ABNORMAL LOW (ref 39.0–52.0)
Hemoglobin: 10 g/dL — ABNORMAL LOW (ref 13.0–17.0)
MCH: 30.4 pg (ref 26.0–34.0)
MCHC: 32.5 g/dL (ref 30.0–36.0)
MCV: 93.6 fL (ref 80.0–100.0)
Platelets: 650 K/uL — ABNORMAL HIGH (ref 150–400)
RBC: 3.29 MIL/uL — ABNORMAL LOW (ref 4.22–5.81)
RDW: 14.1 % (ref 11.5–15.5)
WBC: 15.7 K/uL — ABNORMAL HIGH (ref 4.0–10.5)
nRBC: 0.3 % — ABNORMAL HIGH (ref 0.0–0.2)

## 2024-05-25 LAB — BASIC METABOLIC PANEL WITH GFR
Anion gap: 11 (ref 5–15)
BUN: 18 mg/dL (ref 6–20)
CO2: 30 mmol/L (ref 22–32)
Calcium: 8.7 mg/dL — ABNORMAL LOW (ref 8.9–10.3)
Chloride: 100 mmol/L (ref 98–111)
Creatinine, Ser: 0.72 mg/dL (ref 0.61–1.24)
GFR, Estimated: >60 mL/min (ref >60)
Glucose, Bld: 92 mg/dL (ref 70–99)
Potassium: 3.8 mmol/L (ref 3.5–5.1)
Sodium: 141 mmol/L (ref 135–145)

## 2024-05-25 MED ORDER — GUAIFENESIN-DM 100-10 MG/5ML PO SYRP
5.0000 mL | ORAL_SOLUTION | ORAL | Status: DC | PRN
  Administered 2024-05-25 – 2024-05-30 (×5): 5 mL
  Filled 2024-05-25 (×4): qty 10

## 2024-05-25 MED ORDER — POTASSIUM CHLORIDE 20 MEQ PO PACK
40.0000 meq | PACK | Freq: Once | ORAL | Status: AC
  Administered 2024-05-25: 40 meq
  Filled 2024-05-25: qty 2

## 2024-05-25 MED ORDER — GUAIFENESIN-DM 100-10 MG/5ML PO SYRP
5.0000 mL | ORAL_SOLUTION | ORAL | Status: DC | PRN
  Filled 2024-05-25: qty 10

## 2024-05-25 NOTE — TOC Progression Note (Signed)
 Transition of Care [facility]) - Progression Note    Patient Details  Name: Lucas [name] MRN: [id number] Date of Birth: Jul 03, 1981  Transition of [facility]) CM/SW Contact  Inocente GORMAN Kindle, LCSW Phone Number: 05/25/2024, 10:01 AM  Clinical Narrative:    TOC continuing to follow. Patient remains intubated.    Expected Discharge Plan: IP Rehab Facility Barriers to Discharge: Continued Medical Work up  Expected Discharge Plan and Services                                               Social Determinants of Health (SDOH) Interventions SDOH Screenings   Tobacco Use: High Risk (05/17/2024)    Readmission Risk Interventions     No data to display
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
## 2024-05-25 NOTE — Plan of Care (Signed)
  Problem: Clinical Measurements: Goal: Cardiovascular complication will be avoided Outcome: Progressing   Problem: Nutrition: Goal: Adequate nutrition will be maintained Outcome: Progressing   Problem: Coping: Goal: Level of anxiety will decrease Outcome: Progressing   Problem: Pain Managment: Goal: General experience of comfort will improve and/or be controlled Outcome: Progressing   Problem: Safety: Goal: Ability to remain free from injury will improve Outcome: Progressing   Problem: Clinical Measurements: Goal: Respiratory complications will improve Outcome: Not Progressing
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
## 2024-05-26 ENCOUNTER — Inpatient Hospital Stay (HOSPITAL_COMMUNITY)

## 2024-05-26 LAB — GLUCOSE, CAPILLARY
Glucose-Capillary: 104 mg/dL — ABNORMAL HIGH (ref 70–99)
Glucose-Capillary: 118 mg/dL — ABNORMAL HIGH (ref 70–99)
Glucose-Capillary: 94 mg/dL (ref 70–99)
Glucose-Capillary: 101 mg/dL — ABNORMAL HIGH (ref 70–99)
Glucose-Capillary: 97 mg/dL (ref 70–99)
Glucose-Capillary: 114 mg/dL — ABNORMAL HIGH (ref 70–99)

## 2024-05-26 LAB — BASIC METABOLIC PANEL WITH GFR
Anion gap: 8 (ref 5–15)
BUN: 17 mg/dL (ref 6–20)
CO2: 29 mmol/L (ref 22–32)
Calcium: 8.7 mg/dL — ABNORMAL LOW (ref 8.9–10.3)
Chloride: 103 mmol/L (ref 98–111)
Creatinine, Ser: 0.68 mg/dL (ref 0.61–1.24)
GFR, Estimated: >60 mL/min (ref >60)
Glucose, Bld: 126 mg/dL — ABNORMAL HIGH (ref 70–99)
Potassium: 4.3 mmol/L (ref 3.5–5.1)
Sodium: 140 mmol/L (ref 135–145)

## 2024-05-26 LAB — CBC
HCT: 32.3 % — ABNORMAL LOW (ref 39.0–52.0)
Hemoglobin: 10.3 g/dL — ABNORMAL LOW (ref 13.0–17.0)
MCH: 30.3 pg (ref 26.0–34.0)
MCHC: 31.9 g/dL (ref 30.0–36.0)
MCV: 95 fL (ref 80.0–100.0)
Platelets: 703 K/uL — ABNORMAL HIGH (ref 150–400)
RBC: 3.4 MIL/uL — ABNORMAL LOW (ref 4.22–5.81)
RDW: 14.5 % (ref 11.5–15.5)
WBC: 11.3 K/uL — ABNORMAL HIGH (ref 4.0–10.5)
nRBC: 0 % (ref 0.0–0.2)

## 2024-05-26 MED ORDER — ORAL CARE MOUTH RINSE: 15.0000 mL | OROMUCOSAL | Status: DC | PRN

## 2024-05-26 MED ORDER — MIDAZOLAM HCL 2 MG/2ML IJ SOLN: 1.0000 mg | INTRAMUSCULAR | Status: DC | PRN

## 2024-05-26 MED ORDER — ORAL CARE MOUTH RINSE
15.0000 mL | OROMUCOSAL | Status: DC
  Administered 2024-05-26 – 2024-06-08 (×53): 15 mL via OROMUCOSAL

## 2024-05-26 NOTE — Plan of Care (Signed)
  Problem: Clinical Measurements: Goal: Ability to maintain clinical measurements within normal limits will improve Outcome: Progressing Goal: Diagnostic test results will improve Outcome: Progressing Goal: Respiratory complications will improve Outcome: Progressing   Problem: Nutrition: Goal: Adequate nutrition will be maintained Outcome: Progressing   Problem: Coping: Goal: Level of anxiety will decrease Outcome: Progressing   Problem: Pain Managment: Goal: General experience of comfort will improve and/or be controlled Outcome: Progressing   Problem: Activity: Goal: Risk for activity intolerance will decrease Outcome: Not Progressing   Problem: Elimination: Goal: Will not experience complications related to urinary retention Outcome: Not Progressing

## 2024-05-26 NOTE — Procedures (Signed)
 Extubation Procedure Note  Patient Details:   Name: Lucas [name] DOB: 08-30-1981 MRN: [id number]   Airway Documentation:    Vent end date: 05/26/24 Vent end time: 1024   Evaluation  O2 sats: stable throughout Complications: No apparent complications Patient did tolerate procedure well. Bilateral Breath Sounds: Diminished   No  Positive cuff leak. Placed on 4L no stridor pt not following command to use IS.  Bowe Sidor  Al-Salaam 05/26/2024, 10:40 AM

## 2024-05-26 NOTE — Discharge Instructions (Signed)
 SABRA
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
## 2024-05-27 LAB — CBC
HCT: 29.5 % — ABNORMAL LOW (ref 39.0–52.0)
Hemoglobin: 9.6 g/dL — ABNORMAL LOW (ref 13.0–17.0)
MCH: 30.7 pg (ref 26.0–34.0)
MCHC: 32.5 g/dL (ref 30.0–36.0)
MCV: 94.2 fL (ref 80.0–100.0)
Platelets: 661 K/uL — ABNORMAL HIGH (ref 150–400)
RBC: 3.13 MIL/uL — ABNORMAL LOW (ref 4.22–5.81)
RDW: 14.6 % (ref 11.5–15.5)
WBC: 7.4 K/uL (ref 4.0–10.5)
nRBC: 0 % (ref 0.0–0.2)

## 2024-05-27 LAB — BASIC METABOLIC PANEL WITH GFR
Anion gap: 7 (ref 5–15)
BUN: 18 mg/dL (ref 6–20)
CO2: 29 mmol/L (ref 22–32)
Calcium: 8 mg/dL — ABNORMAL LOW (ref 8.9–10.3)
Chloride: 101 mmol/L (ref 98–111)
Creatinine, Ser: 0.68 mg/dL (ref 0.61–1.24)
GFR, Estimated: >60 mL/min (ref >60)
Glucose, Bld: 124 mg/dL — ABNORMAL HIGH (ref 70–99)
Potassium: 4.1 mmol/L (ref 3.5–5.1)
Sodium: 137 mmol/L (ref 135–145)

## 2024-05-27 LAB — GLUCOSE, CAPILLARY
Glucose-Capillary: 100 mg/dL — ABNORMAL HIGH (ref 70–99)
Glucose-Capillary: 106 mg/dL — ABNORMAL HIGH (ref 70–99)
Glucose-Capillary: 112 mg/dL — ABNORMAL HIGH (ref 70–99)
Glucose-Capillary: 115 mg/dL — ABNORMAL HIGH (ref 70–99)
Glucose-Capillary: 117 mg/dL — ABNORMAL HIGH (ref 70–99)
Glucose-Capillary: 122 mg/dL — ABNORMAL HIGH (ref 70–99)
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
## 2024-05-27 NOTE — Progress Notes (Signed)
  Progress Note   Date: 05/27/2024  Patient Name: Lucas [name]        MRN#: [id number]  Clarification of diagnosis:  Moderate malnutrition

## 2024-05-27 NOTE — Plan of Care (Signed)
  Problem: Clinical Measurements: Goal: Will remain free from infection Outcome: Progressing Goal: Diagnostic test results will improve Outcome: Progressing   Problem: Activity: Goal: Risk for activity intolerance will decrease Outcome: Progressing   Problem: Nutrition: Goal: Adequate nutrition will be maintained Outcome: Progressing   Problem: Pain Managment: Goal: General experience of comfort will improve and/or be controlled Outcome: Progressing   Problem: Skin Integrity: Goal: Risk for impaired skin integrity will decrease Outcome: Progressing

## 2024-05-27 NOTE — TOC Progression Note (Signed)
 Transition of Care [facility]) - Progression Note    Patient Details  Name: Lucas [name] MRN: [id number] Date of Birth: December 19, 1980  [facility]) CM/SW Contact  Inocente GORMAN Kindle, LCSW Phone Number: 05/27/2024, 9:47 AM  Clinical Narrative:    TOC continuing to follow. Patient is extubated. Potential barrier for discharge includes lack of insurance depending on what is recommended.    Expected Discharge Plan: IP Rehab Facility Barriers to Discharge: Continued Medical Work up  Expected Discharge Plan and Services                                               Social Determinants of Health (SDOH) Interventions SDOH Screenings   Tobacco Use: High Risk (05/17/2024)    Readmission Risk Interventions     No data to display

## 2024-05-28 ENCOUNTER — Inpatient Hospital Stay (HOSPITAL_COMMUNITY)

## 2024-05-28 LAB — GLUCOSE, CAPILLARY
Glucose-Capillary: 116 mg/dL — ABNORMAL HIGH (ref 70–99)
Glucose-Capillary: 122 mg/dL — ABNORMAL HIGH (ref 70–99)
Glucose-Capillary: 142 mg/dL — ABNORMAL HIGH (ref 70–99)
Glucose-Capillary: 104 mg/dL — ABNORMAL HIGH (ref 70–99)
Glucose-Capillary: 125 mg/dL — ABNORMAL HIGH (ref 70–99)
Glucose-Capillary: 106 mg/dL — ABNORMAL HIGH (ref 70–99)
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
## 2024-05-28 NOTE — Plan of Care (Signed)
  Problem: Clinical Measurements: Goal: Ability to maintain clinical measurements within normal limits will improve Outcome: Progressing Goal: Will remain free from infection Outcome: Progressing   Problem: Education: Goal: Knowledge of General Education information will improve Description: Including pain rating scale, medication(s)/side effects and non-pharmacologic comfort measures Outcome: Not Progressing   Problem: Health Behavior/Discharge Planning: Goal: Ability to manage health-related needs will improve Outcome: Not Progressing   
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
## 2024-05-29 LAB — GLUCOSE, CAPILLARY
Glucose-Capillary: 110 mg/dL — ABNORMAL HIGH (ref 70–99)
Glucose-Capillary: 112 mg/dL — ABNORMAL HIGH (ref 70–99)
Glucose-Capillary: 112 mg/dL — ABNORMAL HIGH (ref 70–99)
Glucose-Capillary: 90 mg/dL (ref 70–99)
Glucose-Capillary: 91 mg/dL (ref 70–99)
Glucose-Capillary: 128 mg/dL — ABNORMAL HIGH (ref 70–99)

## 2024-05-29 LAB — CBC
HCT: 35.3 % — ABNORMAL LOW (ref 39.0–52.0)
Hemoglobin: 11.8 g/dL — ABNORMAL LOW (ref 13.0–17.0)
MCH: 31.4 pg (ref 26.0–34.0)
MCHC: 33.4 g/dL (ref 30.0–36.0)
MCV: 93.9 fL (ref 80.0–100.0)
Platelets: 784 K/uL — ABNORMAL HIGH (ref 150–400)
RBC: 3.76 MIL/uL — ABNORMAL LOW (ref 4.22–5.81)
RDW: 14.5 % (ref 11.5–15.5)
WBC: 13.4 K/uL — ABNORMAL HIGH (ref 4.0–10.5)
nRBC: 0 % (ref 0.0–0.2)

## 2024-05-29 MED ORDER — CLONIDINE HCL 0.1 MG PO TABS
0.1000 mg | ORAL_TABLET | Freq: Three times a day (TID) | ORAL | Status: DC
  Administered 2024-05-31: 0.1 mg
  Filled 2024-05-29: qty 1

## 2024-05-29 MED ORDER — CLONIDINE HCL 0.2 MG PO TABS
0.2000 mg | ORAL_TABLET | Freq: Three times a day (TID) | ORAL | Status: AC
  Administered 2024-05-30: 0.2 mg
  Filled 2024-05-29 (×2): qty 1

## 2024-05-29 MED ORDER — CLONIDINE HCL 0.2 MG PO TABS
0.3000 mg | ORAL_TABLET | Freq: Three times a day (TID) | ORAL | Status: AC
  Administered 2024-05-29 (×2): 0.3 mg
  Filled 2024-05-29 (×3): qty 1

## 2024-05-29 MED ORDER — CLONIDINE HCL 0.1 MG PO TABS: 0.1000 mg | ORAL_TABLET | Freq: Two times a day (BID) | ORAL | Status: DC

## 2024-05-29 MED ORDER — GLYCOPYRROLATE 1 MG PO TABS
1.0000 mg | ORAL_TABLET | Freq: Two times a day (BID) | ORAL | Status: DC
  Administered 2024-05-29 – 2024-06-02 (×10): 1 mg
  Filled 2024-05-29 (×11): qty 1

## 2024-05-29 NOTE — TOC Progression Note (Signed)
 Transition of Care [facility]) - Progression Note    Patient Details  Name: Lucas [name] MRN: [id number] Date of Birth: 10-Feb-1981  Transition of Care [facility]) CM/SW Contact  Inocente GORMAN Kindle, LCSW Phone Number: 05/29/2024, 10:05 AM  Clinical Narrative:    CSW continuing to follow. Patient disoriented with Cortrak.    Expected Discharge Plan: IP Rehab Facility Barriers to Discharge: Continued Medical Work up  Expected Discharge Plan and Services                                               Social Determinants of Health (SDOH) Interventions SDOH Screenings   Tobacco Use: High Risk (05/17/2024)    Readmission Risk Interventions     No data to display
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
## 2024-05-30 LAB — GLUCOSE, CAPILLARY
Glucose-Capillary: 143 mg/dL — ABNORMAL HIGH (ref 70–99)
Glucose-Capillary: 111 mg/dL — ABNORMAL HIGH (ref 70–99)
Glucose-Capillary: 137 mg/dL — ABNORMAL HIGH (ref 70–99)
Glucose-Capillary: 104 mg/dL — ABNORMAL HIGH (ref 70–99)
Glucose-Capillary: 88 mg/dL (ref 70–99)

## 2024-05-30 LAB — HEPATIC FUNCTION PANEL
ALT: 12 U/L (ref 0–44)
AST: 23 U/L (ref 15–41)
Albumin: 3.1 g/dL — ABNORMAL LOW (ref 3.5–5.0)
Alkaline Phosphatase: 179 U/L — ABNORMAL HIGH (ref 38–126)
Bilirubin, Direct: 0.1 mg/dL (ref 0.0–0.2)
Indirect Bilirubin: 0.5 mg/dL (ref 0.3–0.9)
Total Bilirubin: 0.6 mg/dL (ref 0.0–1.2)
Total Protein: 7.4 g/dL (ref 6.5–8.1)

## 2024-05-30 LAB — VALPROIC ACID LEVEL: Valproic Acid Lvl: 43 ug/mL — ABNORMAL LOW (ref 50–100)
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
## 2024-05-31 ENCOUNTER — Inpatient Hospital Stay (HOSPITAL_COMMUNITY)

## 2024-05-31 LAB — GLUCOSE, CAPILLARY
Glucose-Capillary: 132 mg/dL — ABNORMAL HIGH (ref 70–99)
Glucose-Capillary: 130 mg/dL — ABNORMAL HIGH (ref 70–99)
Glucose-Capillary: 125 mg/dL — ABNORMAL HIGH (ref 70–99)
Glucose-Capillary: 110 mg/dL — ABNORMAL HIGH (ref 70–99)
Glucose-Capillary: 126 mg/dL — ABNORMAL HIGH (ref 70–99)

## 2024-05-31 MED ORDER — CLONIDINE HCL 0.1 MG PO TABS: 0.1000 mg | ORAL_TABLET | Freq: Two times a day (BID) | ORAL | Status: DC

## 2024-05-31 MED ORDER — CLONIDINE HCL 0.2 MG PO TABS
0.2000 mg | ORAL_TABLET | Freq: Three times a day (TID) | ORAL | Status: DC
  Administered 2024-05-31 – 2024-06-01 (×4): 0.2 mg via ORAL
  Filled 2024-05-31 (×5): qty 1

## 2024-05-31 MED ORDER — CLONIDINE HCL 0.1 MG PO TABS: 0.1000 mg | ORAL_TABLET | Freq: Three times a day (TID) | ORAL | Status: DC
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
## 2024-06-01 LAB — GLUCOSE, CAPILLARY
Glucose-Capillary: 134 mg/dL — ABNORMAL HIGH (ref 70–99)
Glucose-Capillary: 116 mg/dL — ABNORMAL HIGH (ref 70–99)
Glucose-Capillary: 129 mg/dL — ABNORMAL HIGH (ref 70–99)

## 2024-06-01 LAB — VALPROIC ACID LEVEL: Valproic Acid Lvl: 45 ug/mL — ABNORMAL LOW (ref 50–100)

## 2024-06-01 MED ORDER — CLONIDINE HCL 0.2 MG PO TABS
0.2000 mg | ORAL_TABLET | Freq: Three times a day (TID) | ORAL | Status: AC
  Administered 2024-06-01: 0.2 mg

## 2024-06-01 MED ORDER — MIDAZOLAM HCL 2 MG/2ML IJ SOLN
1.0000 mg | INTRAMUSCULAR | Status: DC | PRN
  Administered 2024-06-01 – 2024-06-02 (×2): 2 mg via INTRAVENOUS
  Filled 2024-06-01 (×2): qty 2

## 2024-06-01 MED ORDER — BISACODYL 10 MG RE SUPP
10.0000 mg | Freq: Once | RECTAL | Status: DC
  Filled 2024-06-01: qty 1

## 2024-06-01 MED ORDER — VALPROIC ACID 250 MG/5ML PO SOLN
1500.0000 mg | Freq: Three times a day (TID) | ORAL | Status: DC
  Administered 2024-06-01 – 2024-06-03 (×5): 1500 mg
  Filled 2024-06-01 (×5): qty 30

## 2024-06-01 MED ORDER — CLONIDINE HCL 0.1 MG PO TABS
0.1000 mg | ORAL_TABLET | Freq: Two times a day (BID) | ORAL | Status: AC
  Administered 2024-06-03 (×2): 0.1 mg
  Filled 2024-06-01 (×2): qty 1

## 2024-06-01 MED ORDER — DIAZEPAM 5 MG PO TABS
10.0000 mg | ORAL_TABLET | Freq: Four times a day (QID) | ORAL | Status: DC
  Administered 2024-06-01 – 2024-06-02 (×3): 10 mg
  Filled 2024-06-01 (×3): qty 2

## 2024-06-01 MED ORDER — MAGNESIUM CITRATE PO SOLN
0.5000 | Freq: Once | ORAL | Status: DC
  Filled 2024-06-01: qty 296

## 2024-06-01 MED ORDER — DEXMEDETOMIDINE HCL IN NACL 400 MCG/100ML IV SOLN: 0.0000 ug/kg/h | INTRAVENOUS | Status: AC

## 2024-06-01 MED ORDER — CLONIDINE HCL 0.1 MG PO TABS
0.1000 mg | ORAL_TABLET | Freq: Three times a day (TID) | ORAL | Status: AC
  Administered 2024-06-02 (×3): 0.1 mg
  Filled 2024-06-01 (×2): qty 1

## 2024-06-01 MED ORDER — OXYCODONE HCL 5 MG PO TABS
15.0000 mg | ORAL_TABLET | ORAL | Status: DC
  Administered 2024-06-01 – 2024-06-06 (×27): 15 mg
  Filled 2024-06-01 (×28): qty 3

## 2024-06-01 MED ORDER — MAGNESIUM HYDROXIDE 400 MG/5ML PO SUSP: 30.0000 mL | Freq: Once | ORAL | Status: DC

## 2024-06-01 MED ORDER — GUAIFENESIN 100 MG/5ML PO LIQD
10.0000 mL | ORAL | Status: DC
  Administered 2024-06-01 (×3): 10 mL via ORAL
  Filled 2024-06-01 (×4): qty 15

## 2024-06-01 MED ORDER — MIDAZOLAM HCL 2 MG/2ML IJ SOLN
1.0000 mg | Freq: Four times a day (QID) | INTRAMUSCULAR | Status: DC | PRN
  Administered 2024-06-01: 2 mg via INTRAVENOUS
  Filled 2024-06-01: qty 2

## 2024-06-01 MED ORDER — GUAIFENESIN 100 MG/5ML PO LIQD
10.0000 mL | ORAL | Status: DC
  Administered 2024-06-01 – 2024-06-08 (×40): 10 mL
  Filled 2024-06-01 (×2): qty 10
  Filled 2024-06-01: qty 15
  Filled 2024-06-01: qty 10
  Filled 2024-06-01 (×3): qty 15
  Filled 2024-06-01 (×5): qty 10
  Filled 2024-06-01 (×2): qty 15
  Filled 2024-06-01: qty 10
  Filled 2024-06-01: qty 15
  Filled 2024-06-01 (×2): qty 10
  Filled 2024-06-01: qty 15
  Filled 2024-06-01 (×3): qty 10
  Filled 2024-06-01: qty 15
  Filled 2024-06-01 (×2): qty 10
  Filled 2024-06-01: qty 15
  Filled 2024-06-01 (×2): qty 10
  Filled 2024-06-01: qty 15
  Filled 2024-06-01: qty 10
  Filled 2024-06-01: qty 15
  Filled 2024-06-01 (×8): qty 10

## 2024-06-01 MED ORDER — HALOPERIDOL LACTATE 5 MG/ML IJ SOLN
10.0000 mg | Freq: Four times a day (QID) | INTRAMUSCULAR | Status: DC | PRN
  Administered 2024-06-01 – 2024-06-06 (×10): 10 mg via INTRAVENOUS
  Filled 2024-06-01 (×11): qty 2
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
## 2024-06-01 NOTE — TOC Progression Note (Signed)
 Transition of Care [facility]) - Progression Note    Patient Details  Name: Lucas [name] MRN: [id number] Date of Birth: Jul 23, 1981  Transition of [facility]) CM/SW Contact  Madgeline Rayo E Areatha Kalata, LCSW Phone Number: 06/01/2024, 9:55 AM  Clinical Narrative:    TOC continues to follow - patient remains disoriented, with cortrak, will need therapy reevals when medically appropriate. Previous rec for CIR.   Expected Discharge Plan: IP Rehab Facility Barriers to Discharge: Continued Medical Work up  Expected Discharge Plan and Services                                               Social Determinants of Health (SDOH) Interventions SDOH Screenings   Tobacco Use: High Risk (05/17/2024)    Readmission Risk Interventions     No data to display
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
## 2024-06-01 NOTE — TOC Progression Note (Addendum)
 Transition of Care [facility]) - Progression Note    Patient Details  Name: KENNEITH STIEF MRN: [id number] Date of Birth: 1981/08/08  [facility]) CM/SW Contact  Monzerat Handler E Jevaughn Degollado, LCSW Phone Number: 06/01/2024, 3:29 PM  Clinical Narrative:    Called patient's father Garrel to discuss family support post rehab.  PT/OT reevals are pending. Previous rec was CIR.  Garrel (father) states patient is homeless, and Garrel is not sure if there is any family that patient could stay with post rehab.   Called other contacts  -Beryl (uncle)- no answer, no VM option  -Rosaline (cousin)- left a VM requesting a return call. -Grayce (cousin)- left a VM requesting a return call.   Expected Discharge Plan: IP Rehab Facility Barriers to Discharge: Continued Medical Work up  Expected Discharge Plan and Services                                               Social Determinants of Health (SDOH) Interventions SDOH Screenings   Tobacco Use: High Risk (05/17/2024)    Readmission Risk Interventions     No data to display
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
## 2024-06-02 LAB — GLUCOSE, CAPILLARY
Glucose-Capillary: 113 mg/dL — ABNORMAL HIGH (ref 70–99)
Glucose-Capillary: 120 mg/dL — ABNORMAL HIGH (ref 70–99)
Glucose-Capillary: 123 mg/dL — ABNORMAL HIGH (ref 70–99)
Glucose-Capillary: 128 mg/dL — ABNORMAL HIGH (ref 70–99)
Glucose-Capillary: 138 mg/dL — ABNORMAL HIGH (ref 70–99)
Glucose-Capillary: 97 mg/dL (ref 70–99)

## 2024-06-02 MED ORDER — GABAPENTIN 250 MG/5ML PO SOLN
300.0000 mg | Freq: Three times a day (TID) | ORAL | Status: DC
  Administered 2024-06-02 – 2024-06-07 (×15): 300 mg
  Filled 2024-06-02 (×17): qty 6

## 2024-06-02 MED ORDER — MIDAZOLAM HCL 2 MG/2ML IJ SOLN
1.0000 mg | INTRAMUSCULAR | Status: DC | PRN
  Administered 2024-06-02 – 2024-06-03 (×2): 1 mg via INTRAVENOUS
  Filled 2024-06-02 (×2): qty 2

## 2024-06-02 MED ORDER — GABAPENTIN 250 MG/5ML PO SOLN
300.0000 mg | Freq: Three times a day (TID) | ORAL | Status: DC
  Administered 2024-06-02: 300 mg
  Filled 2024-06-02 (×3): qty 6

## 2024-06-02 MED ORDER — DOCUSATE SODIUM 50 MG/5ML PO LIQD
100.0000 mg | Freq: Every day | ORAL | Status: DC
  Administered 2024-06-02 – 2024-06-08 (×5): 100 mg
  Filled 2024-06-02 (×5): qty 10

## 2024-06-02 MED ORDER — DIAZEPAM 5 MG PO TABS
10.0000 mg | ORAL_TABLET | Freq: Four times a day (QID) | ORAL | Status: DC
  Administered 2024-06-02 – 2024-06-04 (×8): 10 mg
  Filled 2024-06-02 (×8): qty 2
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
## 2024-06-02 NOTE — Consult Note (Signed)
 Lucas [name] is a 43 y.o. male admitted for [facility] and psych consulted for possible suicide attempt.   Patient continues to have disorientation, impaired attention, and cannot participate in psychiatric interview. Recommend weening patient off sedation medications such as benzodiazepines (or at least PRN), allowing clonidine  taper to finish, frequent re-orientation, removing restraints, PT/OT to move patient.   We will follow and see patient again on 7/17.   [name], MD PGY-2 Psychiatry Resident 06/02/2024, 11:18 AM

## 2024-06-02 NOTE — TOC Progression Note (Signed)
 Transition of Care [facility]) - Progression Note    Patient Details  Name: Lucas [name] MRN: [id number] Date of Birth: 1981-02-20  Transition of Care [facility] CM/SW Contact  [name], LCSW Phone Number: 06/02/2024, 9:13 AM  Clinical Narrative:    CSW received VM from patient's cousin Lucas [name] from 7/14 evening. Attempted return call to [facility], left a VM requesting a return call to discuss supports post discharge.    Expected Discharge Plan: IP Rehab Facility Barriers to Discharge: Continued Medical Work up  Expected Discharge Plan and Services                                               Social Determinants of Health (SDOH) Interventions SDOH Screenings   Tobacco Use: High Risk (05/17/2024)    Readmission Risk Interventions     No data to display

## 2024-06-02 NOTE — Progress Notes (Signed)
 Inpatient Rehab Admissions Coordinator:    Note progress with therapies but need psych to weigh in on whether Pt. Required inpatient psych admit. I will hold off on CIR consult for now.   Leita Kleine, MS, CCC-SLP Rehab Admissions Coordinator  [phone] (celll) [phone] (office)
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
## 2024-06-02 NOTE — Progress Notes (Signed)
 Patient removed from restraints to ambulate with PT/OT.  Walked to room door with heavy assist.  Patient continues to grab at lines at every opportunity.  Pulling cortrac is his first line he wants to remove.  I had to maintain arm control while PT worked to keep him from grabbing them.  Placed restraints back on after working with PT.

## 2024-06-03 LAB — BASIC METABOLIC PANEL WITH GFR
Anion gap: 16 — ABNORMAL HIGH (ref 5–15)
BUN: 16 mg/dL (ref 6–20)
CO2: 25 mmol/L (ref 22–32)
Calcium: 9.8 mg/dL (ref 8.9–10.3)
Chloride: 97 mmol/L — ABNORMAL LOW (ref 98–111)
Creatinine, Ser: 0.77 mg/dL (ref 0.61–1.24)
GFR, Estimated: >60 mL/min (ref >60)
Glucose, Bld: 144 mg/dL — ABNORMAL HIGH (ref 70–99)
Potassium: 4 mmol/L (ref 3.5–5.1)
Sodium: 138 mmol/L (ref 135–145)

## 2024-06-03 LAB — COMPREHENSIVE METABOLIC PANEL WITH GFR
ALT: 15 U/L (ref 0–44)
AST: 23 U/L (ref 15–41)
Albumin: 3.2 g/dL — ABNORMAL LOW (ref 3.5–5.0)
Alkaline Phosphatase: 157 U/L — ABNORMAL HIGH (ref 38–126)
Anion gap: 12 (ref 5–15)
BUN: 23 mg/dL — ABNORMAL HIGH (ref 6–20)
CO2: 27 mmol/L (ref 22–32)
Calcium: 9.2 mg/dL (ref 8.9–10.3)
Chloride: 98 mmol/L (ref 98–111)
Creatinine, Ser: 0.73 mg/dL (ref 0.61–1.24)
GFR, Estimated: >60 mL/min (ref >60)
Glucose, Bld: 148 mg/dL — ABNORMAL HIGH (ref 70–99)
Potassium: 4.1 mmol/L (ref 3.5–5.1)
Sodium: 137 mmol/L (ref 135–145)
Total Bilirubin: 0.4 mg/dL (ref 0.0–1.2)
Total Protein: 8.3 g/dL — ABNORMAL HIGH (ref 6.5–8.1)

## 2024-06-03 LAB — GLUCOSE, CAPILLARY
Glucose-Capillary: 137 mg/dL — ABNORMAL HIGH (ref 70–99)
Glucose-Capillary: 161 mg/dL — ABNORMAL HIGH (ref 70–99)
Glucose-Capillary: 152 mg/dL — ABNORMAL HIGH (ref 70–99)
Glucose-Capillary: 161 mg/dL — ABNORMAL HIGH (ref 70–99)

## 2024-06-03 LAB — AMMONIA: Ammonia: 79 umol/L — ABNORMAL HIGH (ref 9–35)

## 2024-06-03 MED ORDER — HALOPERIDOL 5 MG PO TABS
5.0000 mg | ORAL_TABLET | Freq: Four times a day (QID) | ORAL | Status: DC
  Administered 2024-06-03: 5 mg via ORAL
  Filled 2024-06-03 (×2): qty 1

## 2024-06-03 MED ORDER — QUETIAPINE FUMARATE 100 MG PO TABS
100.0000 mg | ORAL_TABLET | Freq: Every day | ORAL | Status: DC
  Administered 2024-06-03 – 2024-06-07 (×5): 100 mg
  Filled 2024-06-03 (×5): qty 1

## 2024-06-03 MED ORDER — HALOPERIDOL 5 MG PO TABS
5.0000 mg | ORAL_TABLET | Freq: Four times a day (QID) | ORAL | Status: DC
  Administered 2024-06-03 – 2024-06-08 (×22): 5 mg
  Filled 2024-06-03 (×25): qty 1

## 2024-06-03 MED ORDER — QUETIAPINE FUMARATE 50 MG PO TABS
50.0000 mg | ORAL_TABLET | Freq: Two times a day (BID) | ORAL | Status: DC
  Administered 2024-06-03 – 2024-06-08 (×8): 50 mg
  Filled 2024-06-03 (×6): qty 1
  Filled 2024-06-03: qty 2
  Filled 2024-06-03 (×3): qty 1

## 2024-06-03 MED ORDER — VALPROATE SODIUM 100 MG/ML IV SOLN
1000.0000 mg | Freq: Two times a day (BID) | INTRAVENOUS | Status: DC
  Administered 2024-06-04 – 2024-06-08 (×9): 1000 mg via INTRAVENOUS
  Filled 2024-06-03 (×14): qty 10
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
## 2024-06-03 NOTE — Progress Notes (Addendum)
 SLP Cancellation Note  Patient Details Name: Lucas [name] MRN: [id number] DOB: 1981/01/06   Cancelled treatment:       Reason Eval/Treat Not Completed: Fatigue/lethargy limiting ability to participate. Consulted with RN re: pt appropriateness for speech therapy intervention. RN reports pt up/agitated all night and was recently given haldol . She indicates continued need for suctioning due to poor secretion management. Will continue efforts when pt mentation more conducive to participation.  Shaden Lacher B. Lucas, MSP, CCC-SLP Speech Language Pathologist Office: [phone]  Lucas [name] 06/03/2024, 8:48 AM

## 2024-06-03 NOTE — Progress Notes (Signed)
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
## 2024-06-03 NOTE — Consult Note (Signed)
 Patient seen and attempted to assess by psychiatry. Patient remains too sedated to participate in psychiatric evaluation. Per staff nurse, patient is  sleep all day and up all night. He squirms around in bed, scoots down, aspiration of tube feeds.  He remains restrained at this time and he is observed with soft mitten and violent restraints in place, 1:1 sitter at bedside. He is lying in bed in an awkward position, he does awake to his name being called and physical stimulation (shaking). However quickly doses back to sleep.    -Will order ammonia level, Valproic  acid level (started 07/14), Cmp for tomorrow morning and continue to make appropriate adjustments.  - Continue dietary consult for tube feeds and hypoalbuminemia.  -Due to concerns regarding oversedation will adjust the following: Will reduce formulation and dose of Valproic  Acid to 1000mg  po IV (poor oral absorption, reduce plasma concentration/binding, and hypoalbuminemia). -Switch zyprexa  15 BID to Seroquel  50mg  po BID (8a/3p) and 100mg  po at bedtime.  -DC valium  switch to ativan  1mg  po QID.  -Continue with clonidine  taper.  -Continue with prn Haldol  10mg  IV/IM q6hr.   Psychiatry will continue to follow.

## 2024-06-04 ENCOUNTER — Inpatient Hospital Stay (HOSPITAL_COMMUNITY)

## 2024-06-04 LAB — CBC
HCT: 34.8 % — ABNORMAL LOW (ref 39.0–52.0)
Hemoglobin: 11.5 g/dL — ABNORMAL LOW (ref 13.0–17.0)
MCH: 30.5 pg (ref 26.0–34.0)
MCHC: 33 g/dL (ref 30.0–36.0)
MCV: 92.3 fL (ref 80.0–100.0)
Platelets: 478 K/uL — ABNORMAL HIGH (ref 150–400)
RBC: 3.77 MIL/uL — ABNORMAL LOW (ref 4.22–5.81)
RDW: 14 % (ref 11.5–15.5)
WBC: 19.5 K/uL — ABNORMAL HIGH (ref 4.0–10.5)
nRBC: 0 % (ref 0.0–0.2)

## 2024-06-04 LAB — GLUCOSE, CAPILLARY
Glucose-Capillary: 107 mg/dL — ABNORMAL HIGH (ref 70–99)
Glucose-Capillary: 118 mg/dL — ABNORMAL HIGH (ref 70–99)
Glucose-Capillary: 120 mg/dL — ABNORMAL HIGH (ref 70–99)
Glucose-Capillary: 121 mg/dL — ABNORMAL HIGH (ref 70–99)
Glucose-Capillary: 128 mg/dL — ABNORMAL HIGH (ref 70–99)
Glucose-Capillary: 134 mg/dL — ABNORMAL HIGH (ref 70–99)
Glucose-Capillary: 92 mg/dL (ref 70–99)

## 2024-06-04 LAB — BASIC METABOLIC PANEL WITH GFR
Anion gap: 13 (ref 5–15)
BUN: 17 mg/dL (ref 6–20)
CO2: 28 mmol/L (ref 22–32)
Calcium: 9.6 mg/dL (ref 8.9–10.3)
Chloride: 94 mmol/L — ABNORMAL LOW (ref 98–111)
Creatinine, Ser: 0.84 mg/dL (ref 0.61–1.24)
GFR, Estimated: >60 mL/min (ref >60)
Glucose, Bld: 112 mg/dL — ABNORMAL HIGH (ref 70–99)
Potassium: 4.3 mmol/L (ref 3.5–5.1)
Sodium: 135 mmol/L (ref 135–145)

## 2024-06-04 LAB — URINALYSIS, ROUTINE W REFLEX MICROSCOPIC
Bilirubin Urine: NEGATIVE
Glucose, UA: NEGATIVE mg/dL
Hgb urine dipstick: NEGATIVE
Ketones, ur: NEGATIVE mg/dL
Leukocytes,Ua: NEGATIVE
Nitrite: NEGATIVE
Protein, ur: NEGATIVE mg/dL
Specific Gravity, Urine: 1.023 (ref 1.005–1.030)
pH: 6 (ref 5.0–8.0)

## 2024-06-04 LAB — VALPROIC ACID LEVEL: Valproic Acid Lvl: 47 ug/mL — ABNORMAL LOW (ref 50–100)

## 2024-06-04 MED ORDER — ENSURE PLUS HIGH PROTEIN PO LIQD
237.0000 mL | Freq: Two times a day (BID) | ORAL | Status: DC
  Administered 2024-06-04 – 2024-06-08 (×5): 237 mL via ORAL

## 2024-06-04 MED ORDER — ADULT MULTIVITAMIN W/MINERALS CH
1.0000 | ORAL_TABLET | Freq: Every day | ORAL | Status: DC
  Administered 2024-06-05 – 2024-06-08 (×4): 1 via ORAL
  Filled 2024-06-04 (×4): qty 1

## 2024-06-04 MED ORDER — ACETAMINOPHEN 10 MG/ML IV SOLN
1000.0000 mg | Freq: Once | INTRAVENOUS | Status: AC
  Administered 2024-06-04: 1000 mg via INTRAVENOUS
  Filled 2024-06-04: qty 100

## 2024-06-04 MED ORDER — THIAMINE MONONITRATE 100 MG PO TABS
100.0000 mg | ORAL_TABLET | Freq: Every day | ORAL | Status: DC
  Administered 2024-06-05 – 2024-06-08 (×4): 100 mg via ORAL
  Filled 2024-06-04 (×4): qty 1

## 2024-06-04 MED ORDER — ACETAMINOPHEN 500 MG PO TABS
1000.0000 mg | ORAL_TABLET | Freq: Four times a day (QID) | ORAL | Status: DC
  Administered 2024-06-04 – 2024-06-08 (×16): 1000 mg
  Filled 2024-06-04 (×17): qty 2

## 2024-06-04 MED ORDER — LORAZEPAM 1 MG PO TABS
1.0000 mg | ORAL_TABLET | Freq: Four times a day (QID) | ORAL | Status: DC
  Administered 2024-06-04 – 2024-06-08 (×18): 1 mg
  Filled 2024-06-04 (×18): qty 1

## 2024-06-04 MED ORDER — METOPROLOL TARTRATE 5 MG/5ML IV SOLN: 5.0000 mg | Freq: Four times a day (QID) | INTRAVENOUS | Status: DC | PRN
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
## 2024-06-04 NOTE — Plan of Care (Signed)
  Problem: Clinical Measurements: Goal: Respiratory complications will improve Outcome: Progressing   Problem: Activity: Goal: Risk for activity intolerance will decrease Outcome: Progressing   Problem: Nutrition: Goal: Adequate nutrition will be maintained Outcome: Progressing   Problem: Coping: Goal: Level of anxiety will decrease Outcome: Progressing   Problem: Elimination: Goal: Will not experience complications related to bowel motility Outcome: Progressing Goal: Will not experience complications related to urinary retention Outcome: Progressing   Problem: Pain Managment: Goal: General experience of comfort will improve and/or be controlled Outcome: Progressing   Problem: Education: Goal: Knowledge of General Education information will improve Description: Including pain rating scale, medication(s)/side effects and non-pharmacologic comfort measures Outcome: Not Progressing   Problem: Skin Integrity: Goal: Risk for impaired skin integrity will decrease Outcome: Not Progressing   Problem: Safety: Goal: Non-violent Restraint(s) Outcome: Not Progressing
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
## 2024-06-04 NOTE — Progress Notes (Addendum)
 Cortrak marking at 20cm. TF stopped. Cortrak removed. Pt tolerated removal well with no complications. Michael Macizs, PA notified.
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
## 2024-06-04 NOTE — Progress Notes (Signed)
 IP rehab admissions - I called patient's dad today.  Dad reports that patient is homeless and has no where to go at discharge.  Dad says that he and mom cannot manage patient.  Dad says there is no one who could take him into their home and provide supervision for him.  At this point, patient has no DC plan and no caregiver support.  I will not be able to offer a bed on inpatient rehab for this patient.  [phone]
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
## 2024-06-04 NOTE — Plan of Care (Signed)
   Problem: Education: Goal: Knowledge of General Education information will improve Description: Including pain rating scale, medication(s)/side effects and non-pharmacologic comfort measures Outcome: Not Progressing

## 2024-06-04 NOTE — Progress Notes (Signed)
 RN unable to obtain sputum sample. Pt with no sputum for ordered sputum culture during shift.
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
## 2024-06-05 LAB — GLUCOSE, CAPILLARY
Glucose-Capillary: 97 mg/dL (ref 70–99)
Glucose-Capillary: 106 mg/dL — ABNORMAL HIGH (ref 70–99)
Glucose-Capillary: 119 mg/dL — ABNORMAL HIGH (ref 70–99)
Glucose-Capillary: 125 mg/dL — ABNORMAL HIGH (ref 70–99)
Glucose-Capillary: 119 mg/dL — ABNORMAL HIGH (ref 70–99)
Glucose-Capillary: 126 mg/dL — ABNORMAL HIGH (ref 70–99)

## 2024-06-05 LAB — CBC
HCT: 34 % — ABNORMAL LOW (ref 39.0–52.0)
Hemoglobin: 11.2 g/dL — ABNORMAL LOW (ref 13.0–17.0)
MCH: 30.2 pg (ref 26.0–34.0)
MCHC: 32.9 g/dL (ref 30.0–36.0)
MCV: 91.6 fL (ref 80.0–100.0)
Platelets: 448 K/uL — ABNORMAL HIGH (ref 150–400)
RBC: 3.71 MIL/uL — ABNORMAL LOW (ref 4.22–5.81)
RDW: 13.8 % (ref 11.5–15.5)
WBC: 15.6 K/uL — ABNORMAL HIGH (ref 4.0–10.5)
nRBC: 0 % (ref 0.0–0.2)

## 2024-06-05 LAB — VALPROIC ACID LEVEL: Valproic Acid Lvl: 59 ug/mL (ref 50–100)

## 2024-06-05 LAB — AMMONIA: Ammonia: 95 umol/L — ABNORMAL HIGH (ref 9–35)

## 2024-06-05 MED ORDER — LACTULOSE 10 GM/15ML PO SOLN
30.0000 g | Freq: Two times a day (BID) | ORAL | Status: AC
  Administered 2024-06-05 (×2): 30 g via ORAL
  Filled 2024-06-05 (×2): qty 45

## 2024-06-05 MED ORDER — PIVOT 1.5 CAL PO LIQD
1000.0000 mL | ORAL | Status: DC
  Administered 2024-06-05 – 2024-06-06 (×2): 1000 mL
  Filled 2024-06-05: qty 1000

## 2024-06-05 NOTE — Progress Notes (Signed)
   06/05/24 1836  Urine Measurement/Characteristics  Urinary Interventions Intermittent/Straight cath (performed by Rosaline SAILOR., NT)  Intermittent/Straight Cath (mL) 650 mL  Intermittent Catheter Size 16  Hygiene Peri care   I&O performed per order.
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
## 2024-06-05 NOTE — Plan of Care (Signed)
  Problem: Clinical Measurements: Goal: Respiratory complications will improve Outcome: Progressing   Problem: Activity: Goal: Risk for activity intolerance will decrease Outcome: Progressing   Problem: Nutrition: Goal: Adequate nutrition will be maintained Outcome: Progressing   Problem: Coping: Goal: Level of anxiety will decrease Outcome: Progressing   Problem: Pain Managment: Goal: General experience of comfort will improve and/or be controlled Outcome: Progressing

## 2024-06-05 NOTE — Procedures (Signed)
 Cortrak  Person Inserting Tube:  Lucas [name], Lucas [name], RD Tube Type:  Cortrak - 43 inches Tube Size:  10 Tube Location:  Left nare Initial Placement:  Stomach Secured by: Bridle Technique Used to Measure Tube Placement:  Marking at nare/corner of mouth Cortrak Secured At:  69 cm   Cortrak Tube Team Note:  Consult received to place a Cortrak feeding tube.   No x-ray is required. RN may begin using tube.   If the tube becomes dislodged please keep the tube and contact the Cortrak team at [email or web address] for replacement.  If after hours and replacement cannot be delayed, place a NG tube and confirm placement with an abdominal x-ray.    [name] RD, LDN Clinical Dietitian
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
## 2024-06-05 NOTE — TOC Progression Note (Addendum)
 Transition of Care [facility]) - Progression Note    Patient Details  Name: Lucas [name] MRN: [id number] Date of Birth: Mar 13, 1981  [facility] CM/SW Contact  Levada Bowersox E Alexsander Cavins, LCSW Phone Number: 06/05/2024, 9:18 AM  Clinical Narrative:    [facility] is unable to offer a bed. SNF work up sent out.   10:35- Jon with [facility] is able to make bed offers at [facility] and [facility]. She is aware patient is homeless.  Patient is disoriented x 3. Spoke with patient's father by phone, he chose [facility].  Patient will need to be restraint and sitter free for at least 24 hours before he can go to [facility].  Patient also cannot go to SNF with Cortrak in. PAs notified.   Expected Discharge Plan: IP Rehab Facility Barriers to Discharge: Continued Medical Work up  Expected Discharge Plan and Services                                               Social Determinants of Health (SDOH) Interventions SDOH Screenings   Tobacco Use: High Risk (05/17/2024)    Readmission Risk Interventions     No data to display
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
## 2024-06-06 LAB — GLUCOSE, CAPILLARY
Glucose-Capillary: 108 mg/dL — ABNORMAL HIGH (ref 70–99)
Glucose-Capillary: 116 mg/dL — ABNORMAL HIGH (ref 70–99)
Glucose-Capillary: 118 mg/dL — ABNORMAL HIGH (ref 70–99)
Glucose-Capillary: 124 mg/dL — ABNORMAL HIGH (ref 70–99)
Glucose-Capillary: 136 mg/dL — ABNORMAL HIGH (ref 70–99)
Glucose-Capillary: 157 mg/dL — ABNORMAL HIGH (ref 70–99)

## 2024-06-06 MED ORDER — OXYCODONE HCL 5 MG PO TABS
5.0000 mg | ORAL_TABLET | Freq: Four times a day (QID) | ORAL | Status: DC | PRN
  Administered 2024-06-07: 5 mg
  Filled 2024-06-06: qty 1

## 2024-06-06 MED ORDER — LACTULOSE 10 GM/15ML PO SOLN
30.0000 g | Freq: Three times a day (TID) | ORAL | Status: DC
  Administered 2024-06-06 – 2024-06-08 (×8): 30 g via ORAL
  Filled 2024-06-06 (×9): qty 45

## 2024-06-06 MED ORDER — OXYCODONE HCL 5 MG PO TABS
15.0000 mg | ORAL_TABLET | Freq: Three times a day (TID) | ORAL | Status: DC
  Administered 2024-06-06 – 2024-06-07 (×3): 15 mg
  Filled 2024-06-06 (×3): qty 3

## 2024-06-06 NOTE — Plan of Care (Signed)
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
## 2024-06-07 LAB — GLUCOSE, CAPILLARY
Glucose-Capillary: 129 mg/dL — ABNORMAL HIGH (ref 70–99)
Glucose-Capillary: 99 mg/dL (ref 70–99)
Glucose-Capillary: 117 mg/dL — ABNORMAL HIGH (ref 70–99)
Glucose-Capillary: 111 mg/dL — ABNORMAL HIGH (ref 70–99)
Glucose-Capillary: 136 mg/dL — ABNORMAL HIGH (ref 70–99)
Glucose-Capillary: 123 mg/dL — ABNORMAL HIGH (ref 70–99)

## 2024-06-07 MED ORDER — OXYCODONE HCL 5 MG PO TABS
10.0000 mg | ORAL_TABLET | Freq: Two times a day (BID) | ORAL | Status: DC
  Administered 2024-06-07 – 2024-06-08 (×2): 10 mg
  Filled 2024-06-07 (×2): qty 2

## 2024-06-07 MED ORDER — OXYCODONE HCL 5 MG PO TABS
5.0000 mg | ORAL_TABLET | Freq: Three times a day (TID) | ORAL | Status: DC | PRN
  Administered 2024-06-08 (×2): 5 mg
  Filled 2024-06-07 (×2): qty 1

## 2024-06-07 NOTE — Plan of Care (Signed)
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
## 2024-06-08 ENCOUNTER — Institutional Professional Consult (permissible substitution)
Admission: RE | Admit: 2024-06-08 | Discharge: 2024-06-11 | Disposition: A | Discharge status: Other Acute Inpatient Hospital | Source: Intra-hospital | Attending: Internal Medicine | Admitting: Internal Medicine

## 2024-06-08 ENCOUNTER — Institutional Professional Consult (permissible substitution): Admission: RE | Admit: 2024-06-08 | Source: Home / Self Care

## 2024-06-08 ENCOUNTER — Inpatient Hospital Stay (HOSPITAL_COMMUNITY)

## 2024-06-08 LAB — GLUCOSE, CAPILLARY
Glucose-Capillary: 109 mg/dL — ABNORMAL HIGH (ref 70–99)
Glucose-Capillary: 113 mg/dL — ABNORMAL HIGH (ref 70–99)
Glucose-Capillary: 118 mg/dL — ABNORMAL HIGH (ref 70–99)
Glucose-Capillary: 120 mg/dL — ABNORMAL HIGH (ref 70–99)
Glucose-Capillary: 122 mg/dL — ABNORMAL HIGH (ref 70–99)
Glucose-Capillary: 221 mg/dL — ABNORMAL HIGH (ref 70–99)

## 2024-06-08 LAB — AMMONIA: Ammonia: 50 umol/L — ABNORMAL HIGH (ref 9–35)

## 2024-06-08 MED ORDER — LACTULOSE 10 GM/15ML PO SOLN
30.0000 g | Freq: Three times a day (TID) | ORAL | Status: AC
Start: 2024-06-08 — End: ?

## 2024-06-08 MED ORDER — OXYCODONE HCL 10 MG PO TABS: 10.0000 mg | ORAL_TABLET | Freq: Two times a day (BID) | ORAL | Status: AC

## 2024-06-08 MED ORDER — ALBUTEROL SULFATE (2.5 MG/3ML) 0.083% IN NEBU: 2.5000 mg | INHALATION_SOLUTION | Freq: Four times a day (QID) | RESPIRATORY_TRACT | Status: AC | PRN

## 2024-06-08 MED ORDER — HALOPERIDOL 5 MG PO TABS: 5.0000 mg | ORAL_TABLET | Freq: Four times a day (QID) | ORAL | Status: AC

## 2024-06-08 MED ORDER — HYDRALAZINE HCL 20 MG/ML IJ SOLN: 10.0000 mg | INTRAMUSCULAR | Status: AC | PRN

## 2024-06-08 MED ORDER — CHLORHEXIDINE GLUCONATE CLOTH 2 % EX PADS: 6.0000 | MEDICATED_PAD | Freq: Every day | CUTANEOUS | Status: AC

## 2024-06-08 MED ORDER — ENSURE PLUS HIGH PROTEIN PO LIQD
237.0000 mL | Freq: Three times a day (TID) | ORAL | Status: DC
  Administered 2024-06-08 (×2): 237 mL via ORAL

## 2024-06-08 MED ORDER — ENOXAPARIN SODIUM 30 MG/0.3ML IJ SOSY: 30.0000 mg | PREFILLED_SYRINGE | Freq: Two times a day (BID) | INTRAMUSCULAR | Status: AC

## 2024-06-08 MED ORDER — VALPROATE SODIUM 100 MG/ML IV SOLN: 1000.0000 mg | Freq: Two times a day (BID) | INTRAVENOUS | Status: AC

## 2024-06-08 MED ORDER — GUAIFENESIN 100 MG/5ML PO LIQD: 10.0000 mL | ORAL | Status: AC

## 2024-06-08 MED ORDER — HALOPERIDOL LACTATE 5 MG/ML IJ SOLN: 10.0000 mg | Freq: Four times a day (QID) | INTRAMUSCULAR | Status: AC | PRN

## 2024-06-08 MED ORDER — QUETIAPINE FUMARATE 50 MG PO TABS: ORAL_TABLET | ORAL | Status: AC

## 2024-06-08 MED ORDER — VITAMIN B-1 100 MG PO TABS: 100.0000 mg | ORAL_TABLET | Freq: Every day | ORAL | Status: AC

## 2024-06-08 MED ORDER — ACETAMINOPHEN 500 MG PO TABS: 1000.0000 mg | ORAL_TABLET | Freq: Four times a day (QID) | ORAL | Status: AC

## 2024-06-08 MED ORDER — ENSURE PLUS HIGH PROTEIN PO LIQD: 237.0000 mL | Freq: Three times a day (TID) | ORAL | Status: AC

## 2024-06-08 MED ORDER — GABAPENTIN 250 MG/5ML PO SOLN: 200.0000 mg | Freq: Three times a day (TID) | ORAL | Status: AC

## 2024-06-08 MED ORDER — DOCUSATE SODIUM 50 MG/5ML PO LIQD: 100.0000 mg | Freq: Every day | ORAL | Status: AC

## 2024-06-08 MED ORDER — LORAZEPAM 1 MG PO TABS: 1.0000 mg | ORAL_TABLET | Freq: Four times a day (QID) | ORAL | Status: AC

## 2024-06-08 MED ORDER — OXYCODONE HCL 5 MG PO TABS: 5.0000 mg | ORAL_TABLET | Freq: Three times a day (TID) | ORAL | Status: AC | PRN

## 2024-06-08 MED ORDER — FOLIC ACID 1 MG PO TABS: 1.0000 mg | ORAL_TABLET | Freq: Every day | ORAL | Status: AC

## 2024-06-08 MED ORDER — ONDANSETRON 4 MG PO TBDP: 4.0000 mg | ORAL_TABLET | Freq: Four times a day (QID) | ORAL | Status: AC | PRN

## 2024-06-08 MED ORDER — QUETIAPINE FUMARATE 50 MG PO TABS
50.0000 mg | ORAL_TABLET | Freq: Two times a day (BID) | ORAL | Status: DC
  Administered 2024-06-08: 50 mg
  Filled 2024-06-08: qty 1

## 2024-06-08 MED ORDER — ASPIRIN 81 MG PO CHEW: 81.0000 mg | CHEWABLE_TABLET | Freq: Every day | ORAL | Status: AC

## 2024-06-08 MED ORDER — ADULT MULTIVITAMIN W/MINERALS CH: 1.0000 | ORAL_TABLET | Freq: Every day | ORAL | Status: AC

## 2024-06-08 MED ORDER — ORAL CARE MOUTH RINSE: 15.0000 mL | Freq: Every day | OROMUCOSAL | Status: AC

## 2024-06-08 MED ORDER — METOPROLOL TARTRATE 5 MG/5ML IV SOLN: 5.0000 mg | Freq: Four times a day (QID) | INTRAVENOUS | Status: AC | PRN

## 2024-06-08 MED ORDER — ORAL CARE MOUTH RINSE: 15.0000 mL | OROMUCOSAL | Status: AC | PRN

## 2024-06-08 MED ORDER — GABAPENTIN 250 MG/5ML PO SOLN
200.0000 mg | Freq: Three times a day (TID) | ORAL | Status: DC
  Administered 2024-06-08 (×2): 200 mg
  Filled 2024-06-08 (×3): qty 4

## 2024-06-08 MED ORDER — QUETIAPINE FUMARATE 50 MG PO TABS: 50.0000 mg | ORAL_TABLET | Freq: Every day | ORAL | Status: DC

## 2024-06-08 NOTE — Unmapped External Note (Signed)
 RT Initial Assessment  Admitting Diagnosis: <principal problem not specified>  Admitting Diagnosis Reviewed: I have reviewed the patient's admitting diagnosis and problem list.  Past Medical History: No past medical history on file.  Past Medical History Reviewed: I have reviewed the patient's past medical history.  ABG:    No results found for: PHART, PCO2ART, PO2ART, HCO3ART, BEART, O2SATART  Most Recent ABG Reviewed: Not available  Active Medications Reviewed: I have reviewed the patient's active medications.  Most Recent CXR Reviewed: Not available   RT Initial Assessment     Row Name 06/08/24 2225     Physical Assessment   Most Recent CXR CXR not available   Mallampati Score II   R Breath Sounds Diminished   L Breath Sounds Diminished   Chest Excursion L equal to R   Breathing Pattern Eupnea   Cough Non-productive   Secretions/Sputum Amount None   Secretions/Sputum Color None   Sputum Consistency None   Pulse 99   RR 20   $ Respiratory Assessment Complete                       AVE COCA, RT 06/08/24 10:26 PM EDT
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
## 2024-06-08 NOTE — Plan of Care (Signed)
  Problem: Education: Goal: Knowledge of General Education information will improve Description: Including pain rating scale, medication(s)/side effects and non-pharmacologic comfort measures Outcome: Progressing   Problem: Clinical Measurements: Goal: Ability to maintain clinical measurements within normal limits will improve Outcome: Progressing Goal: Will remain free from infection Outcome: Progressing Goal: Diagnostic test results will improve Outcome: Progressing Goal: Respiratory complications will improve Outcome: Progressing Goal: Cardiovascular complication will be avoided Outcome: Progressing   Problem: Activity: Goal: Risk for activity intolerance will decrease Outcome: Progressing   Problem: Nutrition: Goal: Adequate nutrition will be maintained Outcome: Progressing   Problem: Elimination: Goal: Will not experience complications related to bowel motility Outcome: Progressing Goal: Will not experience complications related to urinary retention Outcome: Progressing   Problem: Skin Integrity: Goal: Risk for impaired skin integrity will decrease Outcome: Progressing
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
## 2024-06-08 NOTE — TOC Transition Note (Addendum)
 Transition of Care [facility]) - Discharge Note   Patient Details  Name: Lucas [name] MRN: [id number] Date of Birth: 12/20/1980  Transition of Care [facility]) CM/SW Contact:  Amrom Ore E Kaylee Wombles, LCSW Phone Number: 06/08/2024, 3:09 PM   Clinical Narrative:    Patient still with Cortrak, non violent restraints. CSW was notified that [facility] received a referral on patient last week from Provider. Ciera with [facility] states patient is eligible and they do have beds today. Patient's father is agreeable to transfer to [facility].  Updated Jon with MFA SNFs and cancelled PASRR screening for now. Per Ciera, they can DC to SNF from [facility] when medically appropriate.   Final next level of care: Long Term Acute Care (LTAC) Barriers to Discharge: Barriers Resolved   Patient Goals and CMS Choice   CMS Medicare.gov Compare Post Acute Care list provided to:: Patient Represenative (must comment) Choice offered to / list presented to : Parent      Discharge Placement                  Name of family member notified: Garrel Patient and family notified of of transfer: 06/08/24  Discharge Plan and Services Additional resources added to the After Visit Summary for                                       Social Drivers of Health (SDOH) Interventions SDOH Screenings   Tobacco Use: High Risk (05/17/2024)     Readmission Risk Interventions     No data to display

## 2024-06-09 LAB — CBC WITH DIFFERENTIAL/PLATELET
Abs Immature Granulocytes: 0.34 K/uL — ABNORMAL HIGH (ref 0.00–0.07)
Basophils Absolute: 0.2 K/uL — ABNORMAL HIGH (ref 0.0–0.1)
Basophils Relative: 1 %
Eosinophils Absolute: 0.7 K/uL — ABNORMAL HIGH (ref 0.0–0.5)
Eosinophils Relative: 4 %
HCT: 33.4 % — ABNORMAL LOW (ref 39.0–52.0)
Hemoglobin: 11.4 g/dL — ABNORMAL LOW (ref 13.0–17.0)
Immature Granulocytes: 2 %
Lymphocytes Relative: 13 %
Lymphs Abs: 2.3 K/uL (ref 0.7–4.0)
MCH: 30.3 pg (ref 26.0–34.0)
MCHC: 34.1 g/dL (ref 30.0–36.0)
MCV: 88.8 fL (ref 80.0–100.0)
Monocytes Absolute: 1.8 K/uL — ABNORMAL HIGH (ref 0.1–1.0)
Monocytes Relative: 10 %
Neutro Abs: 12.3 K/uL — ABNORMAL HIGH (ref 1.7–7.7)
Neutrophils Relative %: 70 %
Platelets: 445 K/uL — ABNORMAL HIGH (ref 150–400)
RBC: 3.76 MIL/uL — ABNORMAL LOW (ref 4.22–5.81)
RDW: 13.9 % (ref 11.5–15.5)
WBC: 17.8 K/uL — ABNORMAL HIGH (ref 4.0–10.5)
nRBC: 0 % (ref 0.0–0.2)

## 2024-06-09 LAB — COMPREHENSIVE METABOLIC PANEL WITH GFR
ALT: 46 U/L — ABNORMAL HIGH (ref 0–44)
AST: 29 U/L (ref 15–41)
Albumin: 3.2 g/dL — ABNORMAL LOW (ref 3.5–5.0)
Alkaline Phosphatase: 125 U/L (ref 38–126)
Anion gap: 12 (ref 5–15)
BUN: 15 mg/dL (ref 6–20)
CO2: 24 mmol/L (ref 22–32)
Calcium: 9.4 mg/dL (ref 8.9–10.3)
Chloride: 97 mmol/L — ABNORMAL LOW (ref 98–111)
Creatinine, Ser: 0.78 mg/dL (ref 0.61–1.24)
GFR, Estimated: >60 mL/min (ref >60)
Glucose, Bld: 108 mg/dL — ABNORMAL HIGH (ref 70–99)
Potassium: 3.6 mmol/L (ref 3.5–5.1)
Sodium: 133 mmol/L — ABNORMAL LOW (ref 135–145)
Total Bilirubin: 0.5 mg/dL (ref 0.0–1.2)
Total Protein: 7.5 g/dL (ref 6.5–8.1)
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
## 2024-06-09 NOTE — Unmapped External Note (Signed)
"        Speech Language Pathologist         Weekly Progress Note  Patient Name: Chistian Kasler Patient Birthdate: 1981/07/02   SLP Current Functional Status:  New admission, Plan of Care to be determined.          Goals:  Goal Status on Evaluation Current Status                                                                                                                                                                                                               STURGILL, SAUNDRA L., CCC-SLP 06/09/2024               "

## 2024-06-09 NOTE — Unmapped External Note (Signed)
" °  Problem: Inadequate Oral Intake Description: Oral food/beverage intake that is less than established reference standards or recommendations based on physiological needs.  Related to: Limited food acceptance due physiological or behavioral issues, aversions, or unsupported beliefs/attitudes As evidenced by: h/o poor po intake Goal: Adequate Meal intake Flowsheets (Taken 06/09/2024 0941) Collaboration and Referral of Nutrition Care: Collaboration with Interdisciplinary Team Meals and Snacks: (Full liquids) Modify composition of meals/snacks Vitamin and Mineral Supplement Therapy: (Folic acid , Thiamine )  Multi-Vitamin  Vitamin Indicator/Monitor: PO intake, diet advancement, wt, labs, GI, skin   "

## 2024-06-09 NOTE — Unmapped External Note (Signed)
  Problem: Potential for Compromised Skin Integrity Goal: Skin integrity is maintained or improved Outcome: Progressing Flowsheets (Taken 06/09/2024 0933) Skin Integrity is Maintained or Improved: SABRA Assess skin and skin risk for breakdown . Relieve pressure to bony prominences, avoid shearing . Turn Patient . Keep skin clean and dry Goal: Nutritional status is improving Outcome: Progressing Flowsheets (Taken 06/09/2024 0933) Nutritional Status is Improving: Collaborate with clinical nutritionist   Problem: Urinary Incontinence Goal: Perineal skin integrity is maintained or improved Outcome: Progressing Flowsheets (Taken 06/09/2024 0933) Perineal skin integrity is maintained or improved: . Keep skin clean and dry . Apply skin protectant   Problem: Bowel Incontinence Goal: Perineal Skin Integrity is Maintained or Improved Outcome: Progressing Flowsheets (Taken 06/09/2024 0933) Perineal Skin Integrity is Maintained or Improved: . Keep skin clean and dry . Apply skin protectant
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
## 2024-06-09 NOTE — Unmapped External Note (Signed)
 Patient felt to be at baseline.  No cognitive therapy needed at this time.  Please reconsult as necessary.

## 2024-06-09 NOTE — Progress Notes (Signed)
 Respiratory Progress Note Room #:5E10  Admit Date:06/08/2024  Pulmonologist:  Discharge Date:  DX/HX:Trauma  Isolation:  Code Status:Full   Airway Type? (Trach/ETT):  Size:  Cuffed/Cuffless?:  Placement at lip?:  High Risk Airway?:  Date of insertion:  Trach changed:  PMV started:  Cap started:  Decannulated/extubated:   Ventilator ID Number: Current ventilator settings:  Current weaning orders:  Date of first wean:  Date of liberation from vent:   Non-invasive Ventilation Current settings:   Home use? Yes/no:  Frequency? HS/PRN/Continuous:   Oxygen Therapy Modality? Nasal Cannula/NRB/Trach Collar/etc.:RA  FiO2:  LPM:   Treatments Drug: Dose: Frequency: Other:                        RT Progress Date Time Important Event or Update RT Initials  06/09/2024 0627 RA BP
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
## 2024-06-10 NOTE — Progress Notes (Signed)
 Respiratory Progress Note Room #:5E10  Admit Date:06/08/2024  Pulmonologist:  Discharge Date:  DX/HX:Trauma  Isolation:  Code Status:Full   Airway Type? (Trach/ETT):  Size:  Cuffed/Cuffless?:  Placement at lip?:  High Risk Airway?:  Date of insertion:  Trach changed:  PMV started:  Cap started:  Decannulated/extubated:   Ventilator ID Number: Current ventilator settings:  Current weaning orders:  Date of first wean:  Date of liberation from vent:   Non-invasive Ventilation Current settings:   Home use? Yes/no:  Frequency? HS/PRN/Continuous:   Oxygen Therapy Modality? Nasal Cannula/NRB/Trach Collar/etc.:RA  FiO2:  LPM:   Treatments Drug: Dose: Frequency: Other:                        RT Progress Date Time Important Event or Update RT Initials  06/09/2024 0627 RA BP  06/10/24 0241 RA LLP

## 2024-06-10 NOTE — Unmapped External Note (Signed)
  Problem: Non-Violent/Non-Self-Destructive Behavior Goal: Remains Free of Injury from Restraints (Restraint for Safety or Interference with Medical Device) Outcome: Not Progressing Flowsheets (Taken 06/10/2024 1424) Remains free of injury from restraints (RESTRAINT FOR SAFETY OR INTERFERENCE WITH MEDICAL DEVICE): SABRA Assess for least restrictive form of restraints and ongoing clinical need NV . Monitor restraints according to policy to avoid harm or injury NV . Frequent re-orientation, orient the patient to the environment NV . Monitor patient for physiologic and psychologic needs including restraint release and skin assessment . Evaluate the patient's condition at the time of restraint application NV . Inform patient/family regarding the reason for restraint & provide ongoing education on restraint purpose, use, and ongoing need NV
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
## 2024-06-10 NOTE — Progress Notes (Signed)
 Respiratory Progress Note Room #:5E10  Admit Date:06/08/2024  Pulmonologist:  Discharge Date:  DX/HX:Trauma  Isolation:  Code Status:Full   Airway Type? (Trach/ETT):  Size:  Cuffed/Cuffless?:  Placement at lip?:  High Risk Airway?:  Date of insertion:  Trach changed:  PMV started:  Cap started:  Decannulated/extubated:   Ventilator ID Number: Current ventilator settings:  Current weaning orders:  Date of first wean:  Date of liberation from vent:   Non-invasive Ventilation Current settings:   Home use? Yes/no:  Frequency? HS/PRN/Continuous:   Oxygen Therapy Modality? Nasal Cannula/NRB/Trach Collar/etc.:RA  FiO2:  LPM:   Treatments Drug: Dose: Frequency: Other:                        RT Progress Date Time Important Event or Update RT Initials  06/09/2024 0627 RA BP  06/10/24 0241 RA LLP  06/10/2024 0554 RA BP
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
## 2024-06-10 NOTE — Unmapped External Note (Signed)
 PT Weekly Progress Note  Patient Name: Lucas [name] Patient Birthdate: Jul 21, 1981  PT CURRENT FUNCTIONAL STATUS:  PT Current Functional Status:    PT Current Functional Status:  Pt is a 43 yo M who was evaluated by PT on 06/09/24. PT POC initiated. P: Continue with PT as tolerated.   Factors in Goal Achievement: Facilitating Factors:  Patient motivation Barriers:  Balance deficits, Diminished endurance, Strength limitations and Cognitive deficits     DME Recommendations Common Therapy DME:  (to be determined)  CARE Score Key: 6: Independent. Helper provides no assistance with tasks. A device may or may not have been used. 5: Set-up or clean-up assistance. Helper sets up or cleans up, but does not assist with tasks. Helper may have assisted prior to or following the activity. 4: Supervision or touching assistance. Helper provides verbal cues or touching/steadying or contact guard assistance. Assistance may be provided throughout the activity or intermittently. 3: Partial/moderate assistance. Helper does less than half the effort. Helper lifts, holds, or supports trunk or limbs, but provides less than half the effort. 2: Substantial/maximal assistance. Helper does more than half the effort. Helper lifts or holds trunk or limbs, and provides more than half the effort. 1: Dependent. Helper does all of the effort, or the assistance of two or more helpers is required for the patient to complete the activity. -: Inconsistent or incomplete documentation  Activity not attempted values: 7: Patient refused 9: Not applicable - Not attempted and the patient did not perform this activity prior to the current illness, exacerbation, or injury. 10: Not attempted due to environmental limitations (e.g., lack of equipment, weather constraints) 88: Not attempted due to medical condition or safety concerns  Goals: Goal Status on Admission Current Status  Sit to Lying LTG: Supervision or  touching assistance Sit to Lying - CARE Score: 88 (06/09/24 1227) Sit to Lying - CARE Score: 88 (06/09/24 1227)  Lying to Sitting on Side of Bed LTG: Supervision or touching assistance Lying to Sitting on Side of Bed - CARE Score: 88 (06/09/24 1227) Lying to Sitting on Side of Bed - CARE Score: 88 (06/09/24 1227)  Sit to Stand LTG: Supervision or touching assistance Sit to Stand - CARE Score: 88 (06/09/24 1227) Sit to Stand - CARE Score: 88 (06/09/24 1227)  Chair/Bed-to-Chair Transfer LTG: Supervision or touching assistance Chair/Bed-to-Chair Transfer - CARE Score: 88 (06/09/24 1227) Chair/Bed-to-Chair Transfer - CARE Score: 88 (06/09/24 1227)  Walk 10 Feet LTG: Supervision or touching assistance Walk 10 Feet - CARE Score: 88 (06/09/24 1227) Walk 10 Feet - CARE Score: 88 (06/09/24 1227)    Walking 10 Feet on Uneven Surfaces - CARE Score: 10 (06/09/24 1227) Walking 10 Feet on Uneven Surfaces - CARE Score: 10 (06/09/24 1227)  Walk 50 Feet with Two Turns LTG: Supervision or touching assistance Walk 50 Feet with Two Turns - CARE Score: 88 (06/09/24 1227) Walk 50 Feet with Two Turns - CARE Score: 88 (06/09/24 1227)  Walk 150 Feet LTG: Supervision or touching assistance Walk 150 Feet - CARE Score: 88 (06/09/24 1227) Walk 150 Feet - CARE Score: 88 (06/09/24 1227)  1 Step (Curb) LTG: Supervision or touching assistance 1 Step (Curb) - CARE Score: 88 (06/09/24 1227) 1 Step (Curb) - CARE Score: 88 (06/09/24 1227)  4 Steps LTG: Supervision or touching assistance 4 Steps - CARE Score: 88 (06/09/24 1227) 4 Steps - CARE Score: 88 (06/09/24 1227)    12 Steps - CARE Score: 88 (06/09/24 1227) 12 Steps - CARE  Score: 88 (06/09/24 1227)    Wheel 50 Feet with Two Turns - CARE Score: 88 (06/09/24 1227) Wheel 50 Feet with Two Turns - CARE Score: 88 (06/09/24 1227)    Wheel 150 Feet - CARE Score: 88 (06/09/24 1227) Wheel 150 Feet - CARE Score: 88 (06/09/24 1227)   Other Goals: Goal Status on Admission Current Status  Goal  Status - Patient will demonstrate sitting balance: Supervision Status on Evaluation(Complete Only on Evaluation): unable to assess on evaluation (06/09/24 1125)                                                      Additional Goals &Status: N/A      MERILEE LYNWOOD MATSU., PT 06/10/2024

## 2024-06-10 NOTE — Unmapped External Note (Signed)
 Team Conference Note  Date: 06/10/2024   Time: 2:39 PM EDT    Patient Name:  Lucas [name]      Medical Record Number: [id number]  Date of Birth: Feb 05, 1981 Sex: Male         Room/Bed:  5E10/5E10-A Admit Date/Time:  06/08/2024  8:45 PM  Patient Active Problem List   Diagnosis   . Traumatic brain injury       Team Members Present:  Nursing: Tora Ford Wounds:Shannon Dwiggins Respiratory: Garnette Louder  Pharmacy: [facility] Dietician: Camie Parkinson  Rehab: Lynwood Glatter  Case Manager in Attendance: Dennie Fuse, LPN  Anticipated Discharge 07/03/2024   Discharge Plan: IPR   Barriers to Discharge: Basic living needs (including medication access), Potential need for skills/non-skilled services, Potential need for 24 hour care, Psychosocial issues, Financial resources  Clinical Barriers: Restraints  Suicide Risk Assessment: No Concerns identified at this time  Pain Management:: Pain reported as adequately managed at this time.  Team Goals:  Case Management Goal 1: Getting out of restraints Goal 1 - Progress: Progressing Goal 1 - Expected End Date: 06/28/24        Nursing (Wound and other)     Respiratory Therapy     Pharmacy Current Pharmacy Goals    Pain goal:  Optimize Pain Plan   Behavior/delirium goal:  Minimize Medication Contributions to  Behavior/Delirium        Therapy   PT Weekly Progress Note     PT Weekly Progress Note by Lynwood MORTON Glatter, PT at 06/10/2024  6:24 AM     Author: Lynwood MATSU. Glatter, PT Service: Therapy Author Type: Therapy Manager   Filed: 06/10/2024  6:25 AM Date of Service: 06/10/2024   Editor: Lynwood MATSU. Glatter, PT (Therapy Manager)             PT Weekly Progress Note  Patient Name: [name] Patient Birthdate: July 29, 1981  PT CURRENT FUNCTIONAL STATUS:  PT Current Functional Status:    PT Current Functional Status:  Pt is a 43 yo M who was evaluated by PT on 06/09/24. PT POC initiated. P: Continue  with PT as tolerated.   Factors in Goal Achievement: Facilitating Factors:  Patient motivation Barriers:  Balance deficits, Diminished endurance, Strength limitations and Cognitive deficits     DME Recommendations Common Therapy DME:  (to be determined)  CARE Score Key: 6: Independent. Helper provides no assistance with tasks. A device may or may not have been used. 5: Set-up or clean-up assistance. Helper sets up or cleans up, but does not assist with tasks. Helper may have assisted prior to or following the activity. 4: Supervision or touching assistance. Helper provides verbal cues or touching/steadying or contact guard assistance. Assistance may be provided throughout the activity or intermittently. 3: Partial/moderate assistance. Helper does less than half the effort. Helper lifts, holds, or supports trunk or limbs, but provides less than half the effort. 2: Substantial/maximal assistance. Helper does more than half the effort. Helper lifts or holds trunk or limbs, and provides more than half the effort. 1: Dependent. Helper does all of the effort, or the assistance of two or more helpers is required for the patient to complete the activity. -: Inconsistent or incomplete documentation  Activity not attempted values: 7: Patient refused 9: Not applicable - Not attempted and the patient did not perform this activity prior to the current illness, exacerbation, or injury. 10: Not attempted due to environmental limitations (e.g., lack of equipment, weather constraints) 88: Not attempted due to  medical condition or safety concerns  Goals: Goal Status on Admission Current Status  Sit to Lying LTG: Supervision or touching assistance Sit to Lying - CARE Score: 88 (06/09/24 1227) Sit to Lying - CARE Score: 88 (06/09/24 1227)  Lying to Sitting on Side of Bed LTG: Supervision or touching assistance Lying to Sitting on Side of Bed - CARE Score: 88 (06/09/24 1227) Lying to Sitting on Side of Bed -  CARE Score: 88 (06/09/24 1227)  Sit to Stand LTG: Supervision or touching assistance Sit to Stand - CARE Score: 88 (06/09/24 1227) Sit to Stand - CARE Score: 88 (06/09/24 1227)  Chair/Bed-to-Chair Transfer LTG: Supervision or touching assistance Chair/Bed-to-Chair Transfer - CARE Score: 88 (06/09/24 1227) Chair/Bed-to-Chair Transfer - CARE Score: 88 (06/09/24 1227)  Walk 10 Feet LTG: Supervision or touching assistance Walk 10 Feet - CARE Score: 88 (06/09/24 1227) Walk 10 Feet - CARE Score: 88 (06/09/24 1227)    Walking 10 Feet on Uneven Surfaces - CARE Score: 10 (06/09/24 1227) Walking 10 Feet on Uneven Surfaces - CARE Score: 10 (06/09/24 1227)  Walk 50 Feet with Two Turns LTG: Supervision or touching assistance Walk 50 Feet with Two Turns - CARE Score: 88 (06/09/24 1227) Walk 50 Feet with Two Turns - CARE Score: 88 (06/09/24 1227)  Walk 150 Feet LTG: Supervision or touching assistance Walk 150 Feet - CARE Score: 88 (06/09/24 1227) Walk 150 Feet - CARE Score: 88 (06/09/24 1227)  1 Step (Curb) LTG: Supervision or touching assistance 1 Step (Curb) - CARE Score: 88 (06/09/24 1227) 1 Step (Curb) - CARE Score: 88 (06/09/24 1227)  4 Steps LTG: Supervision or touching assistance 4 Steps - CARE Score: 88 (06/09/24 1227) 4 Steps - CARE Score: 88 (06/09/24 1227)    12 Steps - CARE Score: 88 (06/09/24 1227) 12 Steps - CARE Score: 88 (06/09/24 1227)    Wheel 50 Feet with Two Turns - CARE Score: 88 (06/09/24 1227) Wheel 50 Feet with Two Turns - CARE Score: 88 (06/09/24 1227)    Wheel 150 Feet - CARE Score: 88 (06/09/24 1227) Wheel 150 Feet - CARE Score: 88 (06/09/24 1227)   Other Goals: Goal Status on Admission Current Status  Goal Status - Patient will demonstrate sitting balance: Supervision Status on Evaluation(Complete Only on Evaluation): unable to assess on evaluation (06/09/24 1125)                                                      Additional Goals &Status: N/A      MERILEE LYNWOOD MATSU.,  PT 06/10/2024             OT Weekly Progress Note     OT Weekly Progress Note by Olam HERO. Lyle, [place] at 06/10/2024  7:03 AM     Author: Olam HERO. Lyle, [place] Service: Therapy Author Type: Occupational Therapy Assistant   Filed: 06/10/2024  7:04 AM Date of Service: 06/10/2024   Editor: Olam HERO. Lyle SESSION (Occupational Therapy Assistant)             Occupational Therapy     Weekly Progress Note  Patient Name: Ovadia Lopp Patient Birthdate: 1981-05-28    OT Current Functional Status:  OT evaluation order received; currently pending.            CARE Score Key: 6: Independent. Helper provides no  assistance with tasks. A device may or may not have been used. 5: Set-up or clean-up assistance. Helper sets up or cleans up, but does not assist with tasks. Helper may have assisted prior to or following the activity. 4: Supervision or touching assistance. Helper provides verbal cues or touching/steadying or contact guard assistance. Assistance may be provided throughout the activity or intermittently. 3: Partial/moderate assistance. Helper does less than half the effort. Helper lifts, holds, or supports trunk or limbs, but provides less than half the effort. 2: Substantial/maximal assistance. Helper does more than half the effort. Helper lifts or holds trunk or limbs, and provides more than half the effort. 1: Dependent. Helper does all of the effort, or the assistance of two or more helpers is required for the patient to complete the activity. -: Inconsistent or incomplete documentation  Activity not attempted values: 7: Patient refused 9: Not applicable - Not attempted and the patient did not perform this activity prior to the current illness, exacerbation, or injury. 10: Not attempted due to environmental limitations (e.g., lack of equipment, weather constraints) 88: Not attempted due to medical condition or safety concerns  Goals: Goal Status on Admission Current Status                                                        Other Goals: Goal Status on Admission Current Status                                                                                        Additional Goals &Status: N/A      PIERCE, LISA M., COTA/L 06/10/2024             SLP Weekly Progress Note     SLP Weekly Progress Note by Currie L. Sturgill, CCC-SLP at 06/09/2024 11:26 AM     Author: Saundra L. Sturgill, CCC-SLP Service: -- Chartered loss adjuster Type: Speech and Language Pathologist   Filed: 06/09/2024 11:26 AM Date of Service: 06/09/2024   Editor: Currie L. Sturgill, CCC-SLP Agricultural engineer)             Speech Language Pathologist         Weekly Progress Note  Patient Name: Rohail Klees Patient Birthdate: [birth year]   SLP Current Functional Status:  New admission, Plan of Care to be determined.          Goals:  Goal Status on Evaluation Current Status  STURGILL, SAUNDRA L., CCC-SLP 06/09/2024                     Nutrition Nutrition Goals (Active)     Problem: Inadequate Oral Intake     Dates: Start:  06/09/24      Description: Oral food/beverage intake that is less than established reference standards or recommendations based on physiological needs.  Related to: Limited food acceptance due physiological or behavioral issues, aversions, or unsupported beliefs/attitudes As evidenced by: h/o poor po intake    Goal: Adequate Meal intake     Dates: Start:  06/09/24    Expected End:  07/24/24       Outcomes     Date/Time User Outcome   06/09/24 0943 Camie KYM Parkinson, RD [No Outcome Documented]      Flowsheet     Taken at 06/09/24 [phone]   Collaboration and Referral of Nutrition  Care: Collaboration with Interdisciplinary Team by  Camie KYM Parkinson, RD   Meals and Snacks: Modify composition of meals/snacks by  Camie KYM Parkinson, RD Comment: Full liquids   Vitamin and Mineral Supplement Therapy: Multi-Vitamin;Vitamin by  Camie KYM Parkinson, RD Comment: Folic acid , Thiamine    Indicator/Monitor: PO intake, diet advancement, wt, labs, GI, skin by  Camie KYM Parkinson, RD          Medications:  Current Facility-Administered Medications:  .  acetaminophen  (TYLENOL ) tablet 650 mg, 650 mg, PO/Per Tube, Q6H PRN, Corean JONETTA Daring, MD, 650 mg at 06/09/24 1756 .  amitriptyline  (ELAVIL ) tablet 75 mg, 75 mg, PO/Per Tube, Nightly, Corean JONETTA Daring, MD, 75 mg at 06/09/24 2141 .  aspirin  chewable tablet 81 mg, 81 mg, PO/Per Tube, Once a day, Corean JONETTA Daring, MD, 81 mg at 06/10/24 0913 .  Electrolyte Replacement, , Does not apply, Once a day, Corean JONETTA Daring, MD .  enoxaparin  (LOVENOX ) syringe 30 mg, 30 mg, Subcutaneous, Q12H SCH, Corean JONETTA Daring, MD, 30 mg at 06/10/24 0913 .  famotidine  (PEPCID ) tablet 20 mg, 20 mg, Oral, 2 times per day, Corean JONETTA Daring, MD, 20 mg at 06/10/24 0913 .  folic acid  (FOLVITE ) tablet 1 mg, 1 mg, PO/Per Tube, Once a day, Corean JONETTA Daring, MD, 1 mg at 06/10/24 0913 .  gabapentin  (NEURONTIN ) capsule 200 mg, 200 mg, PO/Per Tube, 3 times per day, Corean JONETTA Daring, MD, 200 mg at 06/10/24 1357 .  guaiFENesin  tablet 200 mg, 200 mg, PO/Per Tube, Q6H PRN, Corean JONETTA Daring, MD .  haloperidol  (HALDOL ) tablet 5 mg, 5 mg, Oral, Q6H PRN, Santosh R Sharma, MD, 5 mg at 06/09/24 1434 .  haloperidol  lactate (HALDOL ) injection 10 mg, 10 mg, Intravenous, Q6H PRN, Corean JONETTA Daring, MD, 10 mg at 06/09/24 1946 .  lactulose  (CHRONULAC ) 10 GM/15ML solution 30 g, 30 g, Oral, 3 times per day, Corean JONETTA Daring, MD, 30 g at 06/10/24 1357 .  LORazepam  (ATIVAN ) tablet 1 mg, 1 mg, PO/Per Tube, Q8H SCH, Corean JONETTA Daring, MD, 1 mg at 06/10/24 1408 .  LORazepam  (ATIVAN ) tablet 1  mg, 1 mg, Oral, Q6H PRN, Santosh R Sharma, MD, 1 mg at 06/09/24 1756 .  magnesium  oxide tablet 400 mg, 400 mg, PO/Per Tube, Q6H PRN **OR** magnesium  sulfate infusion 2 g 50 mL, 2 g, Intravenous, Q2H PRN, Corean JONETTA Daring, MD .  oxyCODONE  (OxyCONTIN ) 12 hr extended release tablet 10 mg, 10 mg, Oral, Q12H SCH, Corean JONETTA Daring, MD, 10 mg at 06/10/24 0912 .  oxyCODONE  (ROXICODONE ) immediate release tablet  5 mg, 5 mg, Oral, Q6H PRN, Santosh R Sharma, MD, 5 mg at 06/09/24 1434 .  potassium chloride  (KLOR-CON ) CR tablet 40 mEq, 40 mEq, PO/Per Tube, Q4H PRN **OR** potassium chloride  20 mEq in 100 mL IVPB premix, 20 mEq, Intravenous, Q2H PRN, Stephanie D Brown, MD .  QUEtiapine  (SEROquel ) tablet 50 mg, 50 mg, PO/Per Tube, 2 times per day, Corean JONETTA Daring, MD, 50 mg at 06/10/24 0913 .  sennosides-docusate sodium  (SENOKOT-S) 8.6-50 MG tablet 2 tablet, 2 tablet, Oral, Once a day, Corean JONETTA Daring, MD, 2 tablet at 06/10/24 1357 .  [START ON 06/11/2024] sennosides-docusate sodium  (SENOKOT-S) 8.6-50 MG tablet 2 tablet, 2 tablet, Oral, Once a day, Corean JONETTA Daring, MD .  Tab-A-Vite/Beta Carotene tablet 1 tablet, 1 tablet, PO/Per Tube, Once a day, Stephanie D Brown, MD, 1 tablet at 06/10/24 0913 .  thiamine  tablet 100 mg, 100 mg, PO/Per Tube, Once a day, Corean JONETTA Daring, MD, 100 mg at 06/10/24 0913 .  Valproic  Acid (DEPAKENE ) 50 MG/ML enteral solution 1,000 mg, 1,000 mg, PO/Per Tube, 2 times per day, Corean JONETTA Daring, MD, 1,000 mg at 06/10/24 0913  Pharmacy: Latest Pharmacy IDT Documentation      Value Time User   Medications Reviewed:  Considerations for Discharge 06/09/2024 11:40 AM  [place]  Dorlene, PharmD   Documented cosiderations for discharge:  Pain; Behavior/Delirium  06/09/2024 11:40 AM [place]  Dorlene, PharmD   Current pain medications:  Medications Related to Management of Pain acetaminophen  (TYLENOL ) tablet 650 mg, 650 mg, PO/Per Tube, Q6H PRN,  Corean JONETTA Daring, MD, 650 mg at 06/09/24  9050 aspirin  chewable tablet 81 mg, 81 mg, PO/Per Tube, Once a day, Corean JONETTA Daring, MD, 81 mg at 06/09/24 9056 oxyCODONE  (OxyCONTIN ) 12 hr extended release tablet 10 mg, 10 mg, Oral,  Q12H SCH, Corean JONETTA Daring, MD, 10 mg at 06/09/24 9057 oxyCODONE  (ROXICODONE ) immediate release tablet 5 mg, 5 mg, Oral, Q6H PRN,  Derryl JONELLE Pottier, MD, 5 mg at 06/09/24 9341   06/09/2024 11:40 AM [place]  Dorlene, PharmD   Barriers to participating in therapy:  Delirium/Behavior Documentation   Flowsheet Row Most Recent Value  Medication regimen: Reviewed for at risk delirium  Discuss medication risk with Interdisciplinary Team: No changes to orders  or frequency of at-risk med administrations since last discussed with  LIP/Treatment team  Risk Factor Notes: Oxycodone , Ativan , Haldol    Patient on Haldol  IV q6hprn 06/09/2024 11:40 AM [place]  Dorlene, PharmD                                                Inpatient Morphine  Milligram Equivalents Per Day 7/21 - 7/24   Values displayed are in units of MME/Day    Order Start / End Date 7/21 Yesterday Today Tomorrow    Daily Totals  7.5 of Unknown (at least 7.5) 45 of Unknown (at least 60) 15 of 60 0 of 60    oxyCODONE  (OxyCONTIN ) 12 hr extended release tablet 10 mg 7/22 - 8/5 -- 30 of 30 15 of 30 0 of 30    oxyCODONE  (ROXICODONE ) immediate release tablet 5 mg 7/21 - 8/4 7.5 of 7.5 15 of 30 0 of 30 0 of 30   * oxyCODONE  (ROXICODONE ) 5 MG immediate release tablet  - ADS Override Pull 7/21 - 7/21 0 of Unknown -- -- --   * oxyCODONE  (ROXICODONE )  5 MG immediate release tablet  - ADS Override Pull 7/22 - 7/22 -- 0 of Unknown -- --    Calculation Errors     Order Type Date Details   oxyCODONE  (ROXICODONE ) 5 MG immediate release tablet  - ADS Override Pull Ordered Dose -- Frequency type could not be determined   oxyCODONE  (ROXICODONE ) 5 MG immediate release tablet  - ADS Override Pull Ordered Dose -- Frequency type could not be determined             Nursing Team Conference:  Bladder Continent: Incontinent Bladder Devices: External catheter Bladder Interventions: None Bladder: Describe level of assistance required and any barriers encountered in management: CONDOM CATH  Bowel Continent: Incontinent Bowel Devices: None Bowel Interventions: None Bowel: Describe level of assistance required and any barriers encountered in management: CONTINUE PLAN OF CARE  Pain: Patient verbalizes pain Pain score: 6 Pain - Functional Impact: Breathing;Turning Aggravating Factors Impacting Pain: Anxiety;Positioning Alleviating Factors Impacting Pain: Distraction;Repositioning;Rest Pain Education Provided: Patient Non-Pharmacologic Pain Interventions: Distractions;Rest;Position/Reposition;Relaxation Emotional/Spiritual Pain Interventions: Emotional Support Pain Consults Initiated or Completed: Other (Comment) Pain Pharmacologic Treatments: Medicated - see MAR Opiate Use Anticipated After Discharge: Yes Pain: Describe patient response (degree, duration, reduction) to interventions during past 7 days: MULTIPLE INTERVENTIONS NEEDED  Nutrition Intake: Oral Nutrition Level of Assistance: Moderate assistance Nutrition Interventions: Modified consistency Nutrition: General appetite of patient over past 7 days, how diet tolerated, how responding to interventions, are boluses or supplements: NEEDS IMPROVEMENT  Safety Status: Restless;Agitated;Poor retention;High fall risk;Impulsive;Confused Safety Interventions: Education Safety: Describe response to interventions during past 7 days, include identified triggers for behavior episodes, and management plans: NEEDS IMPROVEMENT  Cognitive Orientation: Person Cognitive Deficits Observed: Behavior issues;Agitation Cognitive: Describe interventions used during past 7 days and response: REORIENT,  Quality of Sleep: Restless Approximate Hours Slept: 5 Sleep: Describe interventions in use and patient  response during past 7 days: NEEDS MEDICATED TO SLEEP  Medical Issues: TRAMATIC BRAIN INJURY Pain Issues: MEDICATION Educational Topics: SAFETY PRACTICES Patient-Family barriers to learning: Cognition Is patient on dialysis?: No    Mobility: What is the patient's mobility level?: Level 4: OOB + Wt. Bearing + Ambul. + ROM Please identify the most relevant barrier to mobilization: No Barriers   Respiratory Team Conference: Patient is not currently receiving RT services                Nutrition Team Conference:    Diet Orders  (From admission, onward)           Start     Ordered   06/09/24 1800  Nutritional supplement ProSource Nocarb; 1 unit pack; Oral  RD 2 times daily       End/Expires: Until Specified    Question Answer Comment  Select Supplement: ProSource Nocarb   Volume: 1 unit pack   Administration Route: Oral      06/09/24 0949   06/09/24 1143  Adult Diet Regular; 5 Minced & Moist (NDD II); 0 Thin (All Liquids)  Diet effective now       End/Expires: Until Specified    References:    IDDSI Website  Question Answer Comment  Diet Type: Regular   Diet Texture: 5 Minced & Moist (NDD II)   Liquid Consistency: 0 Thin (All Liquids)   Place order in third party system. Done      06/09/24 1143   06/09/24 1000  Nutritional supplement Ensure Plus High Protein; One Can; Oral  2 times daily between meals  End/Expires: Until Specified    Question Answer Comment  Select Supplement: Ensure Plus High Protein   Volume: One Can   Administration Route: Oral      06/09/24 0949            Height: 5' 7 (170.2 cm)  Admit Weight: 152 lb 1.9 oz (69 kg)  Current Weight: 151 lb 3.8 oz (68.6 kg)  Body mass index is 23.69 kg/m. Calorie Count:    Nutrition Intake: Oral Nutrition Risk: Patient at nutrition risk Nutrition Recommendations: Continue current nutrition care plan. Monitor PO intake Nutrition Barriers to D/C: None Nutrition Update: New admit on PO  diet. History of poor po intake.  Wound: Wound Rx: Topical (comment) Wound: Current Status: Skin Intact  *Some images could not be shown.
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
## 2024-06-10 NOTE — Unmapped External Note (Signed)
  Problem: Infection Goal: Absence of infection and prevention of transmission during hospitalization Outcome: Progressing   Problem: Knowledge Deficit Goal: Patient and/or family demonstrate readiness to learn Outcome: Progressing Goal: Patient and/or family verbalizes understanding of education, and/or performs desired skill Outcome: Progressing   Problem: Discharge Planning Goal: Discharge to home or other facility with appropriate resources Outcome: Progressing Goal: Care giver will develop a plan to decrease their burden and enhance comfort in role Outcome: Progressing   Problem: Delirium Goal: Prevent and Manage Delirium Outcome: Progressing   Problem: Fall Safety: Universal Precautions Goal: Free from fall injury Outcome: Progressing Goal: Toilet Magnet Status ([facility] Only) Outcome: Progressing   Problem: Fall Prevention: Medication Bundle Goal: Patient will be free from Fall Injury Outcome: Progressing   Problem: Fall Prevention: Continence Bundle Goal: Patient will be free from Fall Injury Outcome: Progressing   Problem: Fall Prevention: Environment/Sensory Bundle Goal: Patient will be free from Fall Injury Outcome: Progressing   Problem: Fall Prevention Across Bundles Goal: Patient will be free from Fall Injury Outcome: Progressing   Problem: Non-Violent/Non-Self-Destructive Behavior Goal: Remains Free of Injury from Restraints (Restraint for Safety or Interference with Medical Device) Outcome: Progressing Goal: Free from Restraint (s) (Restraint for Safety or Interference with Medical Device) Outcome: Progressing   Problem: Potential for Compromised Skin Integrity Goal: Skin integrity is maintained or improved Outcome: Progressing Goal: Nutritional status is improving Outcome: Progressing   Problem: Urinary Incontinence Goal: Perineal skin integrity is maintained or improved Outcome: Progressing   Problem: Bowel Incontinence Goal: Perineal Skin  Integrity is Maintained or Improved Outcome: Progressing

## 2024-06-10 NOTE — Unmapped External Note (Signed)
"        PT Treatment  Patient Name: Lucas [name] Patient Birthdate: 01-30-1981  Patient Subjective Report -    Pain Assessment Pain Context: Therapy Assessment Prior to Treatment (06/10/24 1128) Pain Assessment: NRS 0-10 (06/10/24 1128) Pain Score: 0 - No pain (06/10/24 1128) Pain Severity - NRS (Calculated): No pain (06/10/24 1128) [place] Agitation Sedation Scale (RASS)/CAM-S: Drowsy (not fully alert, but has sustained awakening to voice (eye opening & contact >10 secs)/lethargic (06/10/24 1128) Critical-Care Pain Observation Tool  [place] Agitation Sedation Scale (RASS)/CAM-S: Drowsy (not fully alert, but has sustained awakening to voice (eye opening & contact >10 secs)/lethargic (06/10/24 1128)      Cognition:    Overall Cognitive Status:  Impairments impacting function   Alert:  Yes   Oriented to Personal Circumstances:  Yes   Oriented to Place:  Yes   Able to Follow 1 Step Commands:  Yes   Arousal:  Eye opening with stimulation and Spontaneous eye opening   Cognition Comments:  [place] level V to level VI, B wrist restraints Safety Awareness:    Safety Awareness:  Poor  Mobility:  Bed Mobility:    Sit to Lying:  Dependent   Comments:  + 2 assist for safety   Lying to Sitting on Edge of Bed:  Dependent   Comments:  + 2 assist for safety Sit to Stand:    Sit to Stand Level of Assistance:  Dependent   Sit to Stand Comments:  +2 assist for safety     Surface:  Even surface Ambulation Level of Assistance:  Total Assistance/Dependent Distance (feet):  10                           Summary:  PT Summary:    PT Summary Comments:  Pt was seen bedside in the am with OT for co treatment/OT evaluation. Pt performed sit to stand transfers with +2 min A for safety. Pt ambulated 10 feet x 2 with +2 assist for safety. PT tolerated edge of bed and ate lunch at edge of bed. Pt returned to supine and donned restraints at end of treatment.  PT Summary Plan of  Care:    PT Summary Plan of Care:  Continue with current plan of care         Pain Evaluation and Follow-up Pain Reassessment: 0, No pain (06/10/24 1128) Nursing notified of patient's pain assessment: Not indicated - pain score 2 or less (06/10/24 1128)   Therapy Minutes   Time In : 1128 Time Out: 1208 Total Time with Patient (Min): 40 min Gait Training: 15 Total Time to be Billed (Min): 15           MITCHELL, [name] G., PT 06/10/2024       "

## 2024-06-10 NOTE — Unmapped External Note (Signed)
"        Occupational Therapy     Weekly Progress Note  Patient Name: Lucas [name] Patient Birthdate: Apr 08, 1981    OT Current Functional Status:  OT evaluation order received; currently pending.            CARE Score Key: 6: Independent. Helper provides no assistance with tasks. A device may or may not have been used. 5: Set-up or clean-up assistance. Helper sets up or cleans up, but does not assist with tasks. Helper may have assisted prior to or following the activity. 4: Supervision or touching assistance. Helper provides verbal cues or touching/steadying or contact guard assistance. Assistance may be provided throughout the activity or intermittently. 3: Partial/moderate assistance. Helper does less than half the effort. Helper lifts, holds, or supports trunk or limbs, but provides less than half the effort. 2: Substantial/maximal assistance. Helper does more than half the effort. Helper lifts or holds trunk or limbs, and provides more than half the effort. 1: Dependent. Helper does all of the effort, or the assistance of two or more helpers is required for the patient to complete the activity. -: Inconsistent or incomplete documentation  Activity not attempted values: 7: Patient refused 9: Not applicable - Not attempted and the patient did not perform this activity prior to the current illness, exacerbation, or injury. 10: Not attempted due to environmental limitations (e.g., lack of equipment, weather constraints) 88: Not attempted due to medical condition or safety concerns  Goals: Goal Status on Admission Current Status                                                       Other Goals: Goal Status on Admission Current Status                                                                                        Additional Goals &Status: N/A      LYLE, LISA M., COTA/L 06/10/2024       "

## 2024-06-11 ENCOUNTER — Emergency Department (HOSPITAL_COMMUNITY)
Admission: EM | Admit: 2024-06-11 | Discharge: 2024-06-13 | Disposition: A | Discharge status: Other Acute Inpatient Hospital | Attending: Emergency Medicine | Admitting: Emergency Medicine

## 2024-06-11 ENCOUNTER — Other Ambulatory Visit: Payer: Self-pay

## 2024-06-11 ENCOUNTER — Encounter (HOSPITAL_COMMUNITY): Payer: Self-pay

## 2024-06-11 DIAGNOSIS — R4689 Other symptoms and signs involving appearance and behavior: Secondary | ICD-10-CM

## 2024-06-11 DIAGNOSIS — S06300A Unspecified focal traumatic brain injury without loss of consciousness, initial encounter: Secondary | ICD-10-CM | POA: Diagnosis not present

## 2024-06-11 DIAGNOSIS — F17210 Nicotine dependence, cigarettes, uncomplicated: Secondary | ICD-10-CM | POA: Diagnosis not present

## 2024-06-11 DIAGNOSIS — S069XAA Unspecified intracranial injury with loss of consciousness status unknown, initial encounter: Secondary | ICD-10-CM | POA: Diagnosis not present

## 2024-06-11 DIAGNOSIS — R456 Violent behavior: Secondary | ICD-10-CM | POA: Diagnosis not present

## 2024-06-11 DIAGNOSIS — F200 Paranoid schizophrenia: Secondary | ICD-10-CM | POA: Diagnosis present

## 2024-06-11 LAB — CBC WITH DIFFERENTIAL/PLATELET
Abs Immature Granulocytes: 0.22 K/uL — ABNORMAL HIGH (ref 0.00–0.07)
Basophils Absolute: 0.1 K/uL (ref 0.0–0.1)
Basophils Relative: 1 %
Eosinophils Absolute: 1.8 K/uL — ABNORMAL HIGH (ref 0.0–0.5)
Eosinophils Relative: 13 %
HCT: 32.1 % — ABNORMAL LOW (ref 39.0–52.0)
Hemoglobin: 10.8 g/dL — ABNORMAL LOW (ref 13.0–17.0)
Immature Granulocytes: 2 %
Lymphocytes Relative: 13 %
Lymphs Abs: 1.8 K/uL (ref 0.7–4.0)
MCH: 29.9 pg (ref 26.0–34.0)
MCHC: 33.6 g/dL (ref 30.0–36.0)
MCV: 88.9 fL (ref 80.0–100.0)
Monocytes Absolute: 1.7 K/uL — ABNORMAL HIGH (ref 0.1–1.0)
Monocytes Relative: 12 %
Neutro Abs: 8.1 K/uL — ABNORMAL HIGH (ref 1.7–7.7)
Neutrophils Relative %: 59 %
Platelets: 395 K/uL (ref 150–400)
RBC: 3.61 MIL/uL — ABNORMAL LOW (ref 4.22–5.81)
RDW: 13.9 % (ref 11.5–15.5)
WBC: 13.6 K/uL — ABNORMAL HIGH (ref 4.0–10.5)
nRBC: 0 % (ref 0.0–0.2)

## 2024-06-11 LAB — BASIC METABOLIC PANEL WITH GFR
Anion gap: 9 (ref 5–15)
BUN: 11 mg/dL (ref 6–20)
CO2: 24 mmol/L (ref 22–32)
Calcium: 8.7 mg/dL — ABNORMAL LOW (ref 8.9–10.3)
Chloride: 98 mmol/L (ref 98–111)
Creatinine, Ser: 0.72 mg/dL (ref 0.61–1.24)
GFR, Estimated: >60 mL/min (ref >60)
Glucose, Bld: 109 mg/dL — ABNORMAL HIGH (ref 70–99)
Potassium: 3.9 mmol/L (ref 3.5–5.1)
Sodium: 131 mmol/L — ABNORMAL LOW (ref 135–145)

## 2024-06-11 MED ORDER — DIVALPROEX SODIUM 500 MG PO DR TAB
1000.0000 mg | DELAYED_RELEASE_TABLET | Freq: Two times a day (BID) | ORAL | Status: DC
  Administered 2024-06-11 – 2024-06-13 (×4): 1000 mg via ORAL
  Filled 2024-06-11: qty 4
  Filled 2024-06-11 (×3): qty 2

## 2024-06-11 MED ORDER — AMITRIPTYLINE HCL 25 MG PO TABS
75.0000 mg | ORAL_TABLET | Freq: Every day | ORAL | Status: DC
  Administered 2024-06-11 – 2024-06-12 (×2): 75 mg via ORAL
  Filled 2024-06-11 (×2): qty 3

## 2024-06-11 MED ORDER — DIPHENHYDRAMINE HCL 50 MG/ML IJ SOLN
50.0000 mg | Freq: Once | INTRAMUSCULAR | Status: AC
  Administered 2024-06-11: 50 mg via INTRAVENOUS
  Filled 2024-06-11: qty 1

## 2024-06-11 MED ORDER — LORAZEPAM 1 MG PO TABS
1.0000 mg | ORAL_TABLET | Freq: Three times a day (TID) | ORAL | Status: DC
  Administered 2024-06-11 – 2024-06-13 (×6): 1 mg via ORAL
  Filled 2024-06-11 (×6): qty 1

## 2024-06-11 MED ORDER — MIDAZOLAM HCL (PF) 10 MG/2ML IJ SOLN
2.0000 mg | Freq: Once | INTRAMUSCULAR | Status: AC
  Administered 2024-06-11: 2 mg via INTRAVENOUS
  Filled 2024-06-11: qty 2

## 2024-06-11 MED ORDER — GABAPENTIN 100 MG PO CAPS
200.0000 mg | ORAL_CAPSULE | Freq: Three times a day (TID) | ORAL | Status: DC
  Administered 2024-06-11 – 2024-06-13 (×6): 200 mg via ORAL
  Filled 2024-06-11 (×6): qty 2

## 2024-06-11 MED ORDER — OXYCODONE-ACETAMINOPHEN 5-325 MG PO TABS
1.0000 | ORAL_TABLET | Freq: Once | ORAL | Status: AC
  Administered 2024-06-11: 1 via ORAL
  Filled 2024-06-11: qty 1

## 2024-06-11 MED ORDER — DROPERIDOL 2.5 MG/ML IJ SOLN
2.5000 mg | Freq: Once | INTRAMUSCULAR | Status: AC
  Administered 2024-06-11: 2.5 mg via INTRAVENOUS
  Filled 2024-06-11: qty 2

## 2024-06-11 MED ORDER — QUETIAPINE FUMARATE 50 MG PO TABS
50.0000 mg | ORAL_TABLET | Freq: Every day | ORAL | Status: DC
  Administered 2024-06-11: 50 mg via ORAL
  Filled 2024-06-11: qty 2

## 2024-06-11 NOTE — ED Notes (Signed)
 IVC Completed. Original copy in red magistrate folder@1803 , 1 copy in orange medrec @1750  drawer, 4 copies on stationary purple clip board @ 1800. Pt in rm 49 currently. DER:[id number] EXP: 06/18/24

## 2024-06-11 NOTE — Progress Notes (Addendum)
 The patient is out of his restraints standing at the bedside. 2 Cna's are in the room with him calling for help. He is being verbally aggressive and threatening. He will not go back to bed. The two male respiratory therapists have come to help. The patiet is more agreeable to get back into bed with the therapists in the room. The Charge nurse puts the patient into a [place] chair with an alarm on. Approximately 30 minutes later the patient is repeatedly alarming with his attempts to get out of the chair. This nurse and the Cna are alternating sitting in the doorway of his room. Another patient is alarming attempting out of bed. When I leave the patient doorway this patient takes off his gown so that the alarm does not go off. He is stumbling around the room holding onto the bed frame to keep from falling. The charge nurse and respiratory are called to place him back into the bed with the restraints applied.

## 2024-06-11 NOTE — Unmapped External Note (Signed)
 Occupational Therapy      Treatment  Patient Name: Lucas [name] Patient Birthdate: 04/29/1981   Patient Subjective Report - I want to walk, and get blood flowing to my head    Pain Assessment Pain Context: Therapy Assessment Prior to Treatment (06/11/24 1000) Pain Assessment: NRS 0-10 (06/11/24 1000) Pain Score: 0 - No pain (06/11/24 1000) Pain Severity - NRS (Calculated): No pain (06/11/24 1000)       Cognition:    Overall Cognitive Status:  Impairments impacting function   Alert:  Yes   Oriented to Personal Circumstances:  Yes   Oriented to Place:  Yes   Oriented to Time:  Yes   Able to Follow 1 Step Commands:  Yes   Able to Follow 2 Step Commands:  No (narrative)   Arousal:  Spontaneous eye opening Safety Awareness:    Safety Awareness:  Poor Behavioral Issues Addressed:    Behavioral Issues Addressed:  Impulsivity, Distractibility, Perseveration and Confusion  ADLS:  Grooming:    Grooming Level of Assist:  Supervision   Grooming Where Assessed:  Edge of bed   Grooming Comments:  Face washing and applying deodorant Toileting:    Toileting Level of Assist:  Touching/Contact Guard   Toileting Where Assessed:  Toilet Upper Body Dressing:    Upper Body Dressing Level of Assist:  Minimal Assistance   Upper Body Dressing Where Assessed:  Edge of bed Lower Body Dressing:    Lower Body Dressing Level of Assist:  Substantial/maximal assistance   Lower Body Dressing Where Assessed:  Edge of bed Upper Body Bathing:    Upper Body Bathing Level of Assist:  Supervision   Upper Body Bathing Where Assessed:  Edge of bed Lower Body Bathing:    Lower Body Bathing Level of Assist::  Supervision   Lower Body Bathing Where Assessed:  Edge of bed  Functional Mobility:  Bed Mobility:    Roll Left and Right Level of Assist:  Independent   Sit to Lying Level of Assistance:  Supervision  Lying to Sitting Edge of Bed-Level of Assistance:  Minimal assistance Sit to Stand:     Sit to Stand Level of Assist:  Touching/contact guard   Device:  Hand held assist Activity Tolerance-Sitting:    Sitting Tolerance:  Good   Sitting Tolerance Level of Assist:  Supervision Activity Tolerance-Standing:    Standing Tolerance:  Good   Standing Tolerance Level of Assist:  Minimal assistance     Balance/Activity :  Sitting Balance Training:    Sitting Balance Level of assist:  Supervision Standing Balance Training:    Standing Balance Level of Assist:  Minimal assistance   Device:  Hand held assist   Standing Balance Training Activities:  Facilitation of midline orientation Endurance Training:    Functional Endurance Training:  Ambulating around room and in hallway with noted R inattention with use of gaitbelt and HHA                             Fall Prevention Recommendations:    Fall Prevention/ Other Recommendations:  Low bed, bed alarm, chair alarm   Communicated Via:  Verbal instruction with staff and Patient/family education Outcomes:    OT Summary Comments:  Pt found supine in bed and in agreement to engage in therapy. Pt completed ADLs as listed above with increased breaks due to decreased sustained attention to task and increased verbal prompting and redirection to task. Pt impulsive throughout session  with standing and ambulating and difficulty with recall of weight bearing status for RUE. Pt has R deviation when ambulating requiring assistance and verbal prompting to self correct. Pt left supine in bed with HOB elevated, call bell, and bed alarm.    OT Summary Plan of Care:  Continue with current plan of care        Pain Evaluation and Follow-up Pain Reassessment: 0, No pain (06/11/24 1000) Nursing notified of patient's pain assessment: Not indicated - pain score 2 or less (06/11/24 1000)      Therapy Minutes   Enter Time Entry for Time Based Charges Here: Time In : 1000 Time Out: 1040 Total Time with Patient (Min): 40 min Self  Care/ADLs: 25 Therapeutic Activities: 15 Total Time to be Billed (Min): 40     BURZLER, MELISSA, OT 06/11/2024

## 2024-06-11 NOTE — ED Notes (Signed)
 Envelope #[id number] -Findings & Custody Envelope #.

## 2024-06-11 NOTE — Progress Notes (Signed)
 The patient is again out of the restraints standing beside the bed naked.  He is  being aggressive and belligerent.  Refuses to get back into the bed. The patient says that his father is coming to get him. He agrees to get back into the bed to use the phone to call his father. I am sitting in the door way of his room to observe him.

## 2024-06-11 NOTE — ED Notes (Signed)
 Visitor to patient room. Patient became aggressive and hostile to staff demanding to go home, repeating vulgarities. Security called to bedside

## 2024-06-11 NOTE — ED Notes (Signed)
 Assuming care of pt at this time

## 2024-06-11 NOTE — ED Triage Notes (Signed)
 Pt brought to ER from [facility] floor. Reports that patient displayed increased agitation last night and early this a.m. prior to treatment with oral medications. Patient states I was just causing a raucous, hootin and a hollerin and they didn't like that

## 2024-06-11 NOTE — ED Notes (Signed)
 Please update [phone] robin cousin

## 2024-06-11 NOTE — Nursing Note (Signed)
 Patient transferred to ED for consult
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
## 2024-06-11 NOTE — ED Notes (Signed)
 IVC paperwork in progress, E-Files eIVC @ 1427, called Magistrate @ 1430, Mag approved, awaiting finding and custody SPC:[id number] EXP: 06/18/24

## 2024-06-11 NOTE — Unmapped External Note (Signed)
 PT Treatment  Patient Name: Lucas [name] Patient Birthdate: 04-09-1981  Patient Subjective Report -    Pain Assessment Pain Context: Therapy Assessment Prior to Treatment (06/11/24 1109) Pain Assessment: NRS 0-10 (06/11/24 1109) Pain Score: 8 - Severe Pain (06/11/24 1109) Pain Severity - NRS (Calculated): Severe (06/11/24 1109) [place] Agitation Sedation Scale (RASS)/CAM-S: Alert and calm (spontaneously pays attention to caregiver) (06/11/24 1109) Critical-Care Pain Observation Tool  [place] Agitation Sedation Scale (RASS)/CAM-S: Alert and calm (spontaneously pays attention to caregiver) (06/11/24 1109)      Cognition:    Overall Cognitive Status:  Impairments impacting function   Alert:  Yes   Oriented to Personal Circumstances:  Yes   Oriented to Place:  Yes   Able to Follow 1 Step Commands:  Yes   Cognition Comments:  Decreased attention span, impaired insight, decreased safety awareness, [place] level V Safety Awareness:    Safety Awareness:  Poor  Mobility:  Bed Mobility:    Sit to Lying:  Touching/contact guard   Lying to Sitting on Edge of Bed:  Minimal assistance Sit to Stand:    Sit to Stand Level of Assistance:  Minimal assistance  Balance/Activity:  Static Sitting:    Static Sitting Balance Support:  Bilateral upper extremity supported and Feet supported   Static Sitting Level of Assistance:  Close Supervision   Surface:  Even surface Assistive Device:  No Device Ambulation Level of Assistance:  Moderate Assistance and Minimal Assistance Distance (feet):  700                           Summary:  PT Summary:    PT Summary Comments:  Pt was seen bedside in the am. Pt performed bed mobility with c/g to min A and verbal cues. Pt transferred sit to stand with min A. Pt ambulated 700 feet with several standing rest breaks and min to mod A and verbal cues. Pt required verbal cues to maintain WB status on R UE and to remain focused  on task at hand. Pt returned to room following treatment and left sitting up in bed with bed alarm on and call bell within reach.  PT Summary Plan of Care:    PT Summary Plan of Care:  Continue with current plan of care         Pain Evaluation and Follow-up Pain Reassessment: 8 (06/11/24 1109) Nursing notified of patient's pain assessment: Other (comment) (Pt medicated prior to treatment willing to participate with therapy.) (06/11/24 1109)   Therapy Minutes   Time In : 1109 Time Out: 1134 Total Time with Patient (Min): 25 min Gait Training: 15 Therapeutic Exercise/ROM: 10 Total Time to be Billed [employer]): [address] G., PT 06/11/2024

## 2024-06-11 NOTE — ED Notes (Signed)
 IVC Completed. Original copy in red magistrate folder@1755 , 1 copy in orange medrec @1746  drawer, 4 copies on stationary green clip board @ 1749(ish). Pt in rm 9 currently. DER:[id number] EXP: 06/18/24
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
## 2024-06-11 NOTE — Progress Notes (Signed)
 Inpatient Psychiatric Referral  Patient was recommended inpatient per Jerel Gravely, NP. There are no available beds at [facility], per [facility] Luke Sprang, RN. Patient was referred to the following out of network facilities:  Destination  Service Provider Request Status Address Phone Fax  [facility]  Pending - Request Sent [address], [place] [address] [phone]  [facility]  Pending - Request Sent [address] [place] [address] [phone]  [facility]  Pending - Request Sent [address] [place] [address] [phone]  [facility]  Pending - Request Sent [address] [place] [address] [phone]  [facility]  Pending - Request Sent [address] [place] [address] [phone]  [facility]  Pending - Request Sent [address] [place] [phone]  [facility]  Pending - Request Sent [address] [place] [address] [phone]    Situation ongoing, CSW to continue following and update chart as more information becomes available.  Harrie Sofia MSW, LCSWA 06/11/2024  10:21PM

## 2024-06-11 NOTE — ED Notes (Signed)
 IVC Served via Technical sales engineer C. Sand @ 1536(ish)
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
## 2024-06-11 NOTE — Progress Notes (Signed)
 The patient continues getting his feet on the floor at the bedside. The respiratory therapist has been sitting at his bedside for the last hour. I am sitting at the door of his room.  The patient is hallucinating stating that he sees food on his table. There is nothing there. He is yelling at myself and the therapist to give him the food. Pushing the table to his bedside he agrees that there is no food. He then starts demanding to call his father again to come and take him home. Attempts to reorient him fail or last for a few moments.Lucas [name]
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
## 2024-06-12 DIAGNOSIS — F200 Paranoid schizophrenia: Secondary | ICD-10-CM

## 2024-06-12 DIAGNOSIS — S069XAA Unspecified intracranial injury with loss of consciousness status unknown, initial encounter: Secondary | ICD-10-CM

## 2024-06-12 DIAGNOSIS — S06300A Unspecified focal traumatic brain injury without loss of consciousness, initial encounter: Secondary | ICD-10-CM | POA: Diagnosis not present

## 2024-06-12 MED ORDER — OXYCODONE HCL ER 10 MG PO T12A
10.0000 mg | EXTENDED_RELEASE_TABLET | Freq: Two times a day (BID) | ORAL | Status: DC
  Administered 2024-06-12 – 2024-06-13 (×3): 10 mg via ORAL
  Filled 2024-06-12 (×3): qty 1

## 2024-06-12 MED ORDER — FOLIC ACID 1 MG PO TABS
1.0000 mg | ORAL_TABLET | Freq: Every day | ORAL | Status: DC
  Administered 2024-06-12 – 2024-06-13 (×2): 1 mg via ORAL
  Filled 2024-06-12 (×2): qty 1

## 2024-06-12 MED ORDER — QUETIAPINE FUMARATE 100 MG PO TABS
100.0000 mg | ORAL_TABLET | Freq: Every day | ORAL | Status: DC
  Administered 2024-06-12: 100 mg via ORAL
  Filled 2024-06-12: qty 1

## 2024-06-12 MED ORDER — ASPIRIN 81 MG PO TBEC
81.0000 mg | DELAYED_RELEASE_TABLET | Freq: Every day | ORAL | Status: DC
  Administered 2024-06-12 – 2024-06-13 (×2): 81 mg via ORAL
  Filled 2024-06-12 (×2): qty 1

## 2024-06-12 MED ORDER — TAB-A-VITE/IRON PO TABS
1.0000 | ORAL_TABLET | Freq: Every day | ORAL | Status: DC
  Administered 2024-06-12 – 2024-06-13 (×2): 1 via ORAL
  Filled 2024-06-12 (×2): qty 1

## 2024-06-12 MED ORDER — THIAMINE MONONITRATE 100 MG PO TABS
100.0000 mg | ORAL_TABLET | Freq: Every day | ORAL | Status: DC
  Administered 2024-06-12 – 2024-06-13 (×2): 100 mg via ORAL
  Filled 2024-06-12 (×5): qty 1

## 2024-06-12 MED ORDER — OXYCODONE HCL 5 MG PO TABS
5.0000 mg | ORAL_TABLET | ORAL | Status: DC | PRN
  Administered 2024-06-12 – 2024-06-13 (×4): 5 mg via ORAL
  Filled 2024-06-12 (×4): qty 1

## 2024-06-12 NOTE — ED Notes (Signed)
 Pt is awake and assisted by the sitter to the RR

## 2024-06-12 NOTE — Progress Notes (Signed)
 LCSW Progress Note  [id number]   Lucas [name]  06/12/2024  10:24 AM  Description:   Inpatient Psychiatric Referral  Patient was recommended inpatient per Lucas Gravely NP). There are no available beds at [facility], per Lucas [name]. Patient was referred to the following out of network facilities:   Destination  Service Provider Address Phone Fax  [facility]  [address], [place] [address] [phone]  [facility]  [address] [place], [place] [address] [phone]  [facility]  [address] [place] [address] [phone]  [facility]  [address] [place] [address] [phone]  [facility]  [address] [place] [address] [phone]  [facility] EFAX  [address] [place], [place] [phone]  [facility]  [address] [place] [address] [phone]      Situation ongoing, CSW to continue following and update chart as more information becomes available.     Lucas [name], MSW, LCSW  06/12/2024 10:24 AM

## 2024-06-12 NOTE — Progress Notes (Signed)
 Inpatient Psychiatric Referral  Patient was recommended inpatient per Jerel Gravely, NP . There are no available beds at [facility], per [facility] Burnard Barter, RN. Patient was referred to the following out of network facilities:  Destination  Service Provider Request Status Address Phone Fax  [facility]  Pending - Request Sent [address], [place] [address] [phone]  [facility]  Pending - Request Sent [address] [place] [address] [phone]  [facility]  Pending - Request Sent [address] [place] [address] [phone]  [facility]  Pending - Request Sent [address] [place] [address] [phone]  [facility]  Pending - Request Sent [address] [place] [address] [phone]  [facility]  Pending - Request Sent [address] [place] [phone]  [facility]  Pending - Request Sent [address] [place] [address] [phone]  [facility]  Pending - Request Sent [address] [place] [address] [phone]     Situation ongoing, CSW to continue following and update chart as more information becomes available.  Harrie Sofia MSW, LCSWA 06/12/2024  7:49PM

## 2024-06-12 NOTE — ED Notes (Signed)
 IVC is current

## 2024-06-12 NOTE — ED Provider Notes (Signed)
 Emergency Medicine Observation Re-evaluation Note  Lucas [name] is a 43 y.o. male, seen on rounds today.  Pt initially presented to the ED for complaints of Aggressive Behavior Currently, the patient is resting comfortably.  Physical Exam  BP (!) 136/97 (BP Location: Left Arm)   Pulse (!) 103   Temp 98.5 F (36.9 C) (Oral)   Resp 18   Ht 5' 6 (1.676 m)   Wt 72.6 kg   SpO2 97%   BMI 25.82 kg/m  Physical Exam General: nad Cardiac: good peripheral perfusion Lungs: bilateral chest rise Psych: resting comfortably  ED Course / MDM  EKG:   I have reviewed the labs performed to date as well as medications administered while in observation.  Recent changes in the last 24 hours include seen by psych recommends inpatient.  Plan  Current plan is for psych placement.     Emil Share, DO 06/12/24 [phone]

## 2024-06-12 NOTE — ED Notes (Signed)
 Pt is having to be re-directed to his bed multiple times by sitter and this RN. He is constantly asking for milk, crackers, showers and his wallet. Pt is unsteady and refuse to be held by the arms to the bathroom.

## 2024-06-12 NOTE — Consult Note (Signed)
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 into feeding tube every 8 (eight) hours., Disp: , Rfl:  .  Multiple Vitamins-Iron (MULTIVITAMINS WITH IRON) TABS tablet, Take 1 tablet by mouth daily. Per tube, Disp: , Rfl:  .  oxyCODONE  (OXY IR/ROXICODONE ) 5 MG immediate release tablet, Take 5 mg by mouth every 4 (four) hours as needed for severe pain (pain score 7-10)., Disp: , Rfl:  .  oxyCODONE  (OXYCONTIN ) 10 mg 12 hr tablet, Take 10 mg by mouth every 12 (twelve) hours. Per tube, Disp: , Rfl:  .  QUEtiapine  (SEROQUEL ) 50 MG tablet, Take 50 mg by mouth at bedtime., Disp: , Rfl:  .  senna (SENOKOT) 8.6 MG TABS tablet, Place 2 tablets into feeding tube daily., Disp: , Rfl:  .  thiamine  (VITAMIN B1) 100 MG tablet, Place 100 mg into feeding tube daily., Disp: , Rfl:  .  valproic  acid (DEPAKENE ) 250 MG/5ML solution, Take 1,000 mg by mouth in the morning and at bedtime., Disp: , Rfl:   Allergies: No Known Allergies  Jerel PARAS Mannie, NP

## 2024-06-13 DIAGNOSIS — S06300A Unspecified focal traumatic brain injury without loss of consciousness, initial encounter: Secondary | ICD-10-CM | POA: Diagnosis not present

## 2024-06-13 NOTE — Progress Notes (Signed)
 Patient has been denied by [facility] due to no appropriate beds available. Patient meets [facility] inpatient criteria per Jerel Gravely, NP. Patient has been faxed out to the following facilities:   [facility]  [address], [place] [address] [phone]  [facility]  [address] [place], [place] [address] [phone]  [facility]  [address] [place] [address] [phone]  [facility]  [address], [place] [address] [phone]  [facility]  [address] [place] [address] [phone]  [facility] EFAX  [address] [place], [place] [phone]  [facility]  [address] [place] [address] [phone]  [facility]  [address], [place] [address] [phone]  [facility]  [address] [place] [address] [phone]  [facility]  [address], [place] [address] [phone]  [facility]  [address] [place] [address] [phone]  [facility]  [address] [place], [place] [address] [phone]  [facility] Patient Placement  [facility], [place] [phone]  [facility]  [address] [place] [address] [phone]  [facility] [address]  [place] [address] [phone]  [facility]  [address] [facility] [address] [place] [address] [phone]  [facility]  [address] [place] [address] [phone]  [facility]  [address] [place], [place] [address] [phone]  [facility]  [address] [place] [address] [phone]  [facility]  [address] [place] [address] [phone]   Bunnie Gallop, MSW, LCSW-A  10:30 AM 06/13/2024

## 2024-06-13 NOTE — ED Notes (Signed)
 IVC IS CURRENT

## 2024-06-13 NOTE — Progress Notes (Signed)
 [facility] LCSW Progress Note   06/13/2024    10:46 AM  Lucas [name]   [id number]   Type of Contact and Topic:  Psychiatric Bed Placement   Pt accepted to [facility] Unit     Patient meets inpatient criteria per Jerel Gravely, NP  The attending provider will be Dr. Oneil Charleston  Call report to [phone]    Farrel Larch, RN @ [facility] ED notified.     Pt scheduled  to arrive at [facility] for TODAY.    Bunnie Gallop, MSW, LCSW-A  10:47 AM 06/13/2024

## 2024-06-13 NOTE — ED Notes (Signed)
 Case Number: [id number] The patient was IVC'd on 06/11/2024 and the documents are set to expire on 06/18/2024. IVC is current and 3 copies are located in the purple zone with the patient.

## 2024-06-13 NOTE — ED Provider Notes (Signed)
 Emergency Medicine Observation Re-evaluation Note  Ildefonso Keaney is a 43 y.o. male, seen on rounds today.  Pt initially presented to the ED for complaints of Aggressive Behavior Currently, the patient is resting comfortably.  Physical Exam  BP (!) 129/93 (BP Location: Right Arm)   Pulse 87   Temp 97.9 F (36.6 C) (Oral)   Resp 18   Ht 5' 6 (1.676 m)   Wt 72.6 kg   SpO2 100%   BMI 25.82 kg/m  Physical Exam General: Resting comfortably in stretcher Lungs: Normal work of breathing Psych: Calm  ED Course / MDM  EKG:EKG Interpretation Date/Time:  Friday June 12 2024 17:18:14 EDT Ventricular Rate:  105 PR Interval:  138 QRS Duration:  86 QT Interval:  322 QTC Calculation: 425 R Axis:   68  Text Interpretation: Sinus tachycardia Otherwise normal ECG When compared with ECG of 11-May-2024 19:27, PREVIOUS ECG IS PRESENT Confirmed by Patt Alm DEL [phone]) on 06/12/2024 5:21:13 PM  I have reviewed the labs performed to date as well as medications administered while in observation.  Recent changes in the last 24 hours include seen by psychiatry who gave medication recommendations.  They are recommending inpatient hospitalization and treatment.  Plan  Current plan is for inpatient placement. Will need valproic  acid level on 7/28 per psychiatry   Yolande Lamar BROCKS, MD 06/13/24 1137

## 2024-06-13 NOTE — ED Notes (Signed)
Pt ambulated to bathroom and back

## 2024-06-14 NOTE — ED Provider Notes (Signed)
 [facility] Emergency Department Provider Note Room: [facility]  B18/B18  Medical Decision Making   Assessment & Plan  Ground-level fall, not anticoagulated with forehead laceration/abrasion.  Was found incidentally to have nonspecific ST elevation on EKG that is most consistent with early repolarization.  He adamantly denies any cardiac symptoms or chest pain.  Progress Notes   12:19 PM Labs are nonactionable.  Mild hyponatremia with history of same.  Will advise outpatient follow-up for this.  Troponin is undetectable.  He appears stable for discharge back to his facility.      MDM Elements     I have reviewed recent and relevant previous record, including: Inpatient notes: 06/11/2024 [facility] health emergency department visit for schizophrenia, aggressive behavior---Na [address]   Social Drivers that significantly affected care: Lacks housing stability and Substance abuse         Disposition   Clinical Impression:     Diagnosis Comment Added By Time Added   Laceration of forehead, initial encounter  Donnice Theodoro Salt, MD 06/14/2024 12:17 PM   Hyponatremia  Donnice Theodoro Salt, MD 06/14/2024 12:18 PM      Final Disposition: Transfer to (Non [facility] Facility)  History   Chief Complaint in Triage  Patient presents with  . Dizziness    Pt BIBA from [facility] where pt is IVC. Pt reports feeling dizzy and fell while getting out of bed. No LOC, -BT. Abnormal EKG sent for eval PTA. Lac to forehead noted. Hx prior TBI.  SABRA Involuntary commitment    History of Present Illness 43 year old who presents after ground-level mechanical fall with forehead abrasion/laceration.  Was found by EMS to have nonspecific ST elevation, likely early repolarization but no older available.  Adamantly denies any heart issues or chest pain.  Will perform wound care, check some screening labs.  Anticipate discharge if labs are reassuring.  Addition Historian(s[facility]  [place]     MEDICATIONS:  Patient's Medications   No medications on file    ALLERGIES: Allergies[1]  PAST MEDICAL HISTORY:  Past Medical History[2]  PAST SURGICAL HISTORY: Past Surgical History[3]  SOCIAL HISTORY:  Social History   Tobacco Use  . Smoking status: Not on file  . Smokeless tobacco: Not on file  Substance Use Topics  . Alcohol use: Not on file       FAMILY HISTORY:  Family History[4]    Physical Exam    ED Triage Vitals  Temp 06/14/24 1050 98 F (36.7 C)  Temp Source 06/14/24 1050 Oral  Heart Rate 06/14/24 1043 98  BP 06/14/24 1043 129/87  Respirations 06/14/24 1043 17  SpO2 06/14/24 1043 97 %   Reviewed vital signs and nursing note as charted by RN.  Physical Exam  Adult Medical Physical Exam Reviewed vital signs and nursing note as charted by RN.   CONSTITUTIONAL: Chronically but not acutely ill-appearing; well-nourished. Alert and oriented and responds appropriately to questions.  HEAD: Normocephalic; superficial abrasion/laceration to the forehead EYES: Left eye enucleation, right eye normal; Conjunctivae clear, sclerae non-icteric ENT: Normal nose; no rhinorrhea; moist mucous membranes NECK: Supple without meningismus; nontender; no masses, evidence of prior tracheostomy CARD: Regular rate and rhythm; no murmurs, no clicks, no rubs, no gallops; symmetric distal pulses RESP: Normal chest excursion without splinting or tachypnea; breath sounds clear and equal bilaterally; no wheezes, no rhonchi, no rales  ABD/GI: Nondistended; soft, nontender, no rebound, no guarding BACK:  Nontender to palpation EXT: Normal ROM in all joints; nontender to  palpation; no cyanosis, no effusions, no edema    SKIN: Normal color for age and race; warm; dry; good turgor; capillary refill < 2 seconds; no acute lesions noted NEURO: Moves all extremities equally; Motor and sensory function intact  PSYCH: The patient's mood and manner are appropriate. Grooming and  personal hygiene are appropriate.      Results    I personally reviewed the results in the chart as listed below  Results for orders placed or performed during the hospital encounter of 06/14/24  CBC with Differential  Result Value Ref Range   Differential Percent Diff %    Differential Absolute Diff Absolute    WBC 12.9 (H) 3.6 - 11.2 K/uL   RBC 3.83 (L) 4.06 - 5.63 M/uL   Hemoglobin 11.2 (L) 12.5 - 16.3 g/dL   Hematocrit 33 (L) 37 - 47 %   Mean Cell Volume 87 74 - 96 fL   Mean Cell Hemoglobin 29 24 - 33 pg   Mean Cell Hemoglobin Concentration 33 33 - 36 g/dL   RDW 84.5 87.6 - 82.9 %   Platelet Count 408 150 - 450 K/uL   Mean Platelet Volume 8.1 7.5 - 11.2 fL   Neutrophils 65 43 - 77 %   Lymphocytes 10 (L) 16 - 44 %   Monocytes 10 5 - 13 %   Eosinophils 14 (H) 1 - 8 %   Basophils 1 0 - 1 %   Neutrophils Absolute 8.4 (H) 1.8 - 7.8 K/uL   Lymphocytes Absolute 1.3 1.0 - 3.0 K/uL   Monocytes Absolute 1.3 (H) 0.3 - 1.0 K/uL   Eosinophils Absolute 1.8 (H) 0.0 - 0.5 K/uL   Basophils Absolute 0.1 0.0 - 0.1 K/uL   Nucleated RBCS 0 /100 WBC  BMP  Result Value Ref Range   Sodium 128 (L) 136 - 145 mmol/L   Potassium 4.3 3.5 - 5.1 mmol/L   Chloride 92 (L) 98 - 107 mmol/L   CO2 26 21 - 31 mmol/L   BUN 9 7 - 25 mg/dL   Creatinine 9.25 9.29 - 1.30 mg/dL   Glucose, Random 91 70 - 199 mg/dL   Calcium , Total 8.8 8.6 - 10.3 mg/dL   Osmolality (calculated) 255 (L) 270 - 295 mOsm/kg   Anion Gap 10 4 - 12  Lactic Acid  Result Value Ref Range   Lactic Acid 0.8 0.5 - 2.0 mmol/L  PT/INR  Result Value Ref Range   PT 13.3 (H) 9.9 - 12.7 sec   INR 1.15 <1.30  APTT  Result Value Ref Range   APTT 27 24 - 37 sec  Troponin I High Sensitivity  Result Value Ref Range   Troponin I High Sensitivity <3 0 - 18 pg/mL  eGFR (CKD-EPI)  Result Value Ref Range   eGFR >60 mL/min/1.48m2  Type and Screen  Result Value Ref Range   ABO Group A    Rh Type POS    Antibody Screen NEG    Sample  Expiration 06/17/2024 23:59   ABO/Rh Confirmation  Result Value Ref Range   Patient ABO Rh Confirmation A POS    Imaging Results   None    ECG Results   None          Donnice Theodoro Salt, MD 06/14/24 1423      [1] No Known Allergies [2] No past medical history on file. [3] No past surgical history on file. [4] No family history on file.

## 2024-06-14 NOTE — ED Notes (Signed)
 Code STEMI downgraded at this time per cards PA at bedside.

## 2024-06-14 NOTE — Progress Notes (Addendum)
 Code STEMI called with 15 min ETA. I was at bedside upon patient's arrival. 43 year old M with no significant cardiac PMH who presented to the ED via EMS with dizziness and falling out of bed. EKG did not meet STEMI criteria, consistent with early repolarization. No chest pain. STEMI downgraded.   Rocky Anette Fam, PA-C

## 2024-06-14 NOTE — ED Notes (Signed)
 Report attempted to [place] [facility] at this time.

## 2024-06-14 NOTE — ED Notes (Signed)
 Pt arrives with [facility] sitter and IVC paperwork from facility.

## 2024-06-15 ENCOUNTER — Encounter (HOSPITAL_COMMUNITY): Payer: Self-pay
